# Patient Record
Sex: Male | Born: 1985 | Race: White | Hispanic: No | Marital: Single | Smoking: Former smoker
Health system: Southern US, Community
[De-identification: ages and names within clinical notes are randomized; demographics above are authoritative.]

## PROBLEM LIST (undated history)

## (undated) DIAGNOSIS — F329 Major depressive disorder, single episode, unspecified: Secondary | ICD-10-CM

## (undated) DIAGNOSIS — F32A Depression, unspecified: Secondary | ICD-10-CM

## (undated) DIAGNOSIS — F419 Anxiety disorder, unspecified: Secondary | ICD-10-CM

---

## 2004-03-22 HISTORY — PX: HAND SURGERY: SHX662

## 2015-09-27 ENCOUNTER — Emergency Department (HOSPITAL_COMMUNITY)
Admission: EM | Admit: 2015-09-27 | Discharge: 2015-09-27 | Disposition: A | Discharge status: Home / Self Care | Payer: Worker's Compensation | Attending: Emergency Medicine | Admitting: Emergency Medicine

## 2015-09-27 ENCOUNTER — Encounter (HOSPITAL_COMMUNITY): Payer: Self-pay | Admitting: Emergency Medicine

## 2015-09-27 DIAGNOSIS — Z87891 Personal history of nicotine dependence: Secondary | ICD-10-CM | POA: Diagnosis not present

## 2015-09-27 DIAGNOSIS — F329 Major depressive disorder, single episode, unspecified: Secondary | ICD-10-CM | POA: Diagnosis not present

## 2015-09-27 DIAGNOSIS — Y9389 Activity, other specified: Secondary | ICD-10-CM | POA: Diagnosis not present

## 2015-09-27 DIAGNOSIS — M542 Cervicalgia: Secondary | ICD-10-CM

## 2015-09-27 DIAGNOSIS — S299XXA Unspecified injury of thorax, initial encounter: Secondary | ICD-10-CM | POA: Insufficient documentation

## 2015-09-27 DIAGNOSIS — S199XXA Unspecified injury of neck, initial encounter: Secondary | ICD-10-CM | POA: Diagnosis not present

## 2015-09-27 DIAGNOSIS — Y92410 Unspecified street and highway as the place of occurrence of the external cause: Secondary | ICD-10-CM | POA: Diagnosis not present

## 2015-09-27 DIAGNOSIS — F419 Anxiety disorder, unspecified: Secondary | ICD-10-CM | POA: Diagnosis not present

## 2015-09-27 DIAGNOSIS — Z79899 Other long term (current) drug therapy: Secondary | ICD-10-CM | POA: Diagnosis not present

## 2015-09-27 DIAGNOSIS — Y990 Civilian activity done for income or pay: Secondary | ICD-10-CM | POA: Insufficient documentation

## 2015-09-27 HISTORY — DX: Major depressive disorder, single episode, unspecified: F32.9

## 2015-09-27 HISTORY — DX: Anxiety disorder, unspecified: F41.9

## 2015-09-27 HISTORY — DX: Depression, unspecified: F32.A

## 2015-09-27 MED ORDER — NAPROXEN 250 MG PO TABS: 250.0000 mg | ORAL_TABLET | Freq: Two times a day (BID) | ORAL | Status: DC

## 2015-09-27 MED ORDER — METHOCARBAMOL 500 MG PO TABS: 500.0000 mg | ORAL_TABLET | Freq: Two times a day (BID) | ORAL | Status: DC | PRN
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
## 2018-11-03 MED FILL — HYDROCODON-APAP 5-325: 5-325 | 6 days supply | Qty: 6 | Fill #0

## 2018-11-13 ENCOUNTER — Emergency Department (HOSPITAL_BASED_OUTPATIENT_CLINIC_OR_DEPARTMENT_OTHER)
Admission: EM | Admit: 2018-11-13 | Discharge: 2018-11-13 | Disposition: A | Discharge status: Home / Self Care | Payer: No Typology Code available for payment source | Attending: Emergency Medicine | Admitting: Emergency Medicine

## 2018-11-13 ENCOUNTER — Other Ambulatory Visit: Payer: Self-pay

## 2018-11-13 ENCOUNTER — Encounter (HOSPITAL_BASED_OUTPATIENT_CLINIC_OR_DEPARTMENT_OTHER): Payer: Self-pay | Admitting: Emergency Medicine

## 2018-11-13 ENCOUNTER — Emergency Department (HOSPITAL_BASED_OUTPATIENT_CLINIC_OR_DEPARTMENT_OTHER): Payer: No Typology Code available for payment source

## 2018-11-13 DIAGNOSIS — F17220 Nicotine dependence, chewing tobacco, uncomplicated: Secondary | ICD-10-CM | POA: Diagnosis not present

## 2018-11-13 DIAGNOSIS — S298XXA Other specified injuries of thorax, initial encounter: Secondary | ICD-10-CM

## 2018-11-13 DIAGNOSIS — W11XXXA Fall on and from ladder, initial encounter: Secondary | ICD-10-CM | POA: Insufficient documentation

## 2018-11-13 DIAGNOSIS — Z79899 Other long term (current) drug therapy: Secondary | ICD-10-CM | POA: Diagnosis not present

## 2018-11-13 DIAGNOSIS — S299XXA Unspecified injury of thorax, initial encounter: Secondary | ICD-10-CM | POA: Diagnosis present

## 2018-11-13 DIAGNOSIS — Y9389 Activity, other specified: Secondary | ICD-10-CM | POA: Insufficient documentation

## 2018-11-13 DIAGNOSIS — S20211A Contusion of right front wall of thorax, initial encounter: Secondary | ICD-10-CM

## 2018-11-13 DIAGNOSIS — Y999 Unspecified external cause status: Secondary | ICD-10-CM | POA: Diagnosis not present

## 2018-11-13 DIAGNOSIS — Y929 Unspecified place or not applicable: Secondary | ICD-10-CM | POA: Diagnosis not present

## 2018-11-13 IMAGING — DX DG RIBS W/ CHEST 3+V*R*
5 series · 5 of 5 positions shown · non-contrast
Comparison: None.

CLINICAL DATA: Right posterior rib pain post 8 foot fall.

EXAM:
RIGHT RIBS AND CHEST - 3+ VIEW

[chest pa]
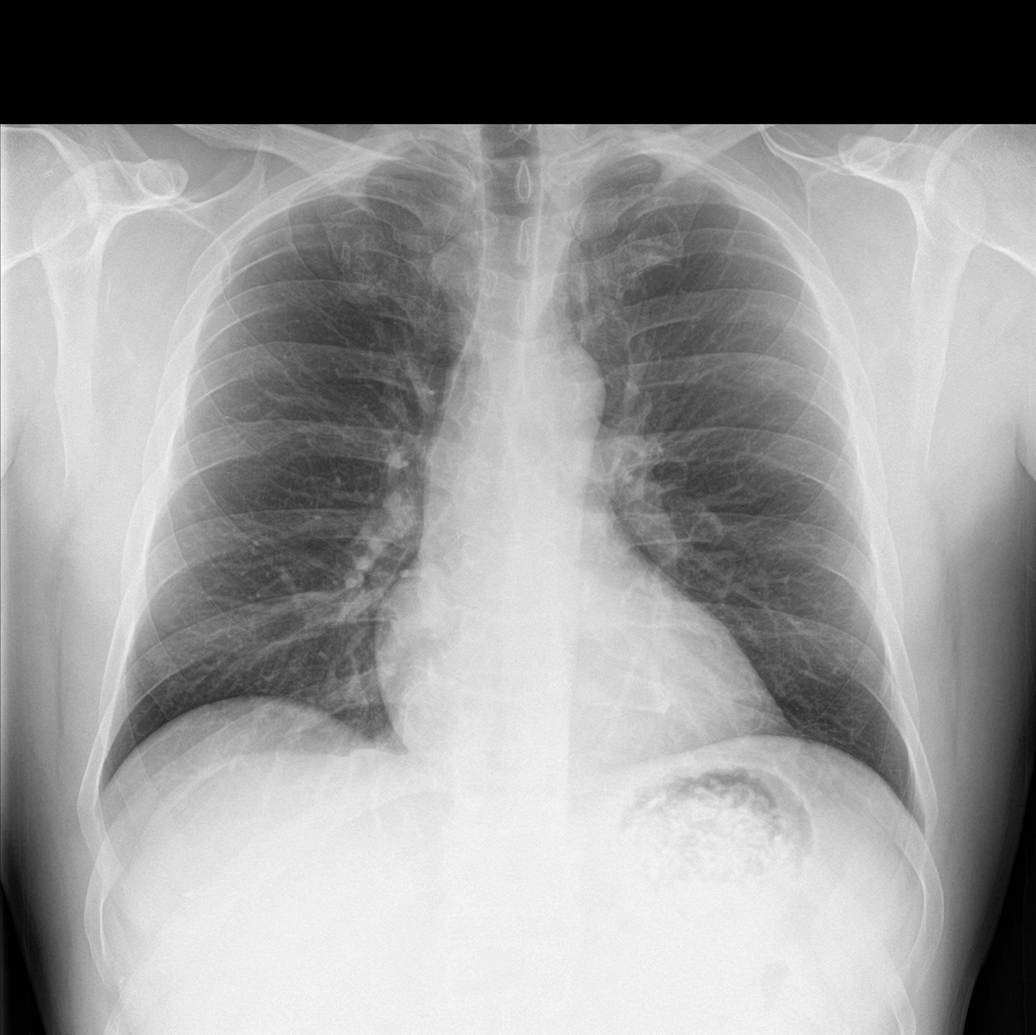

[rib ap (1 of 2)]
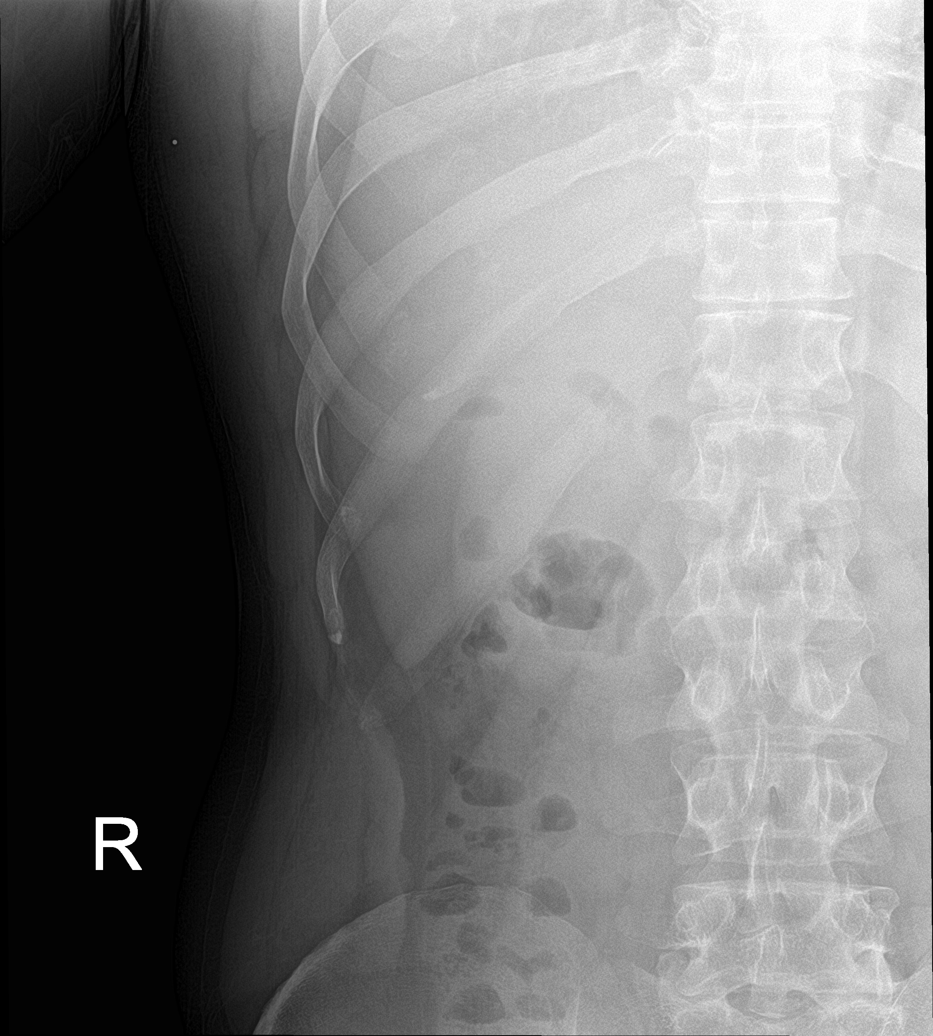

[rib ap (2 of 2)]
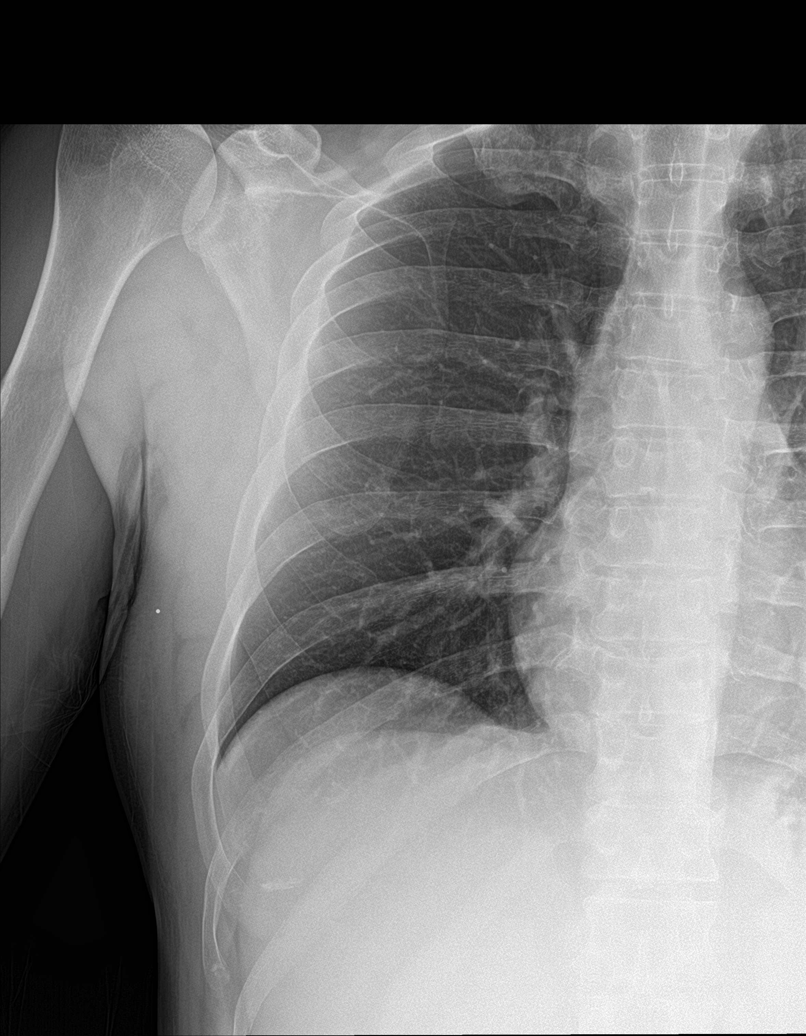

[rib ap obl (1 of 2)]
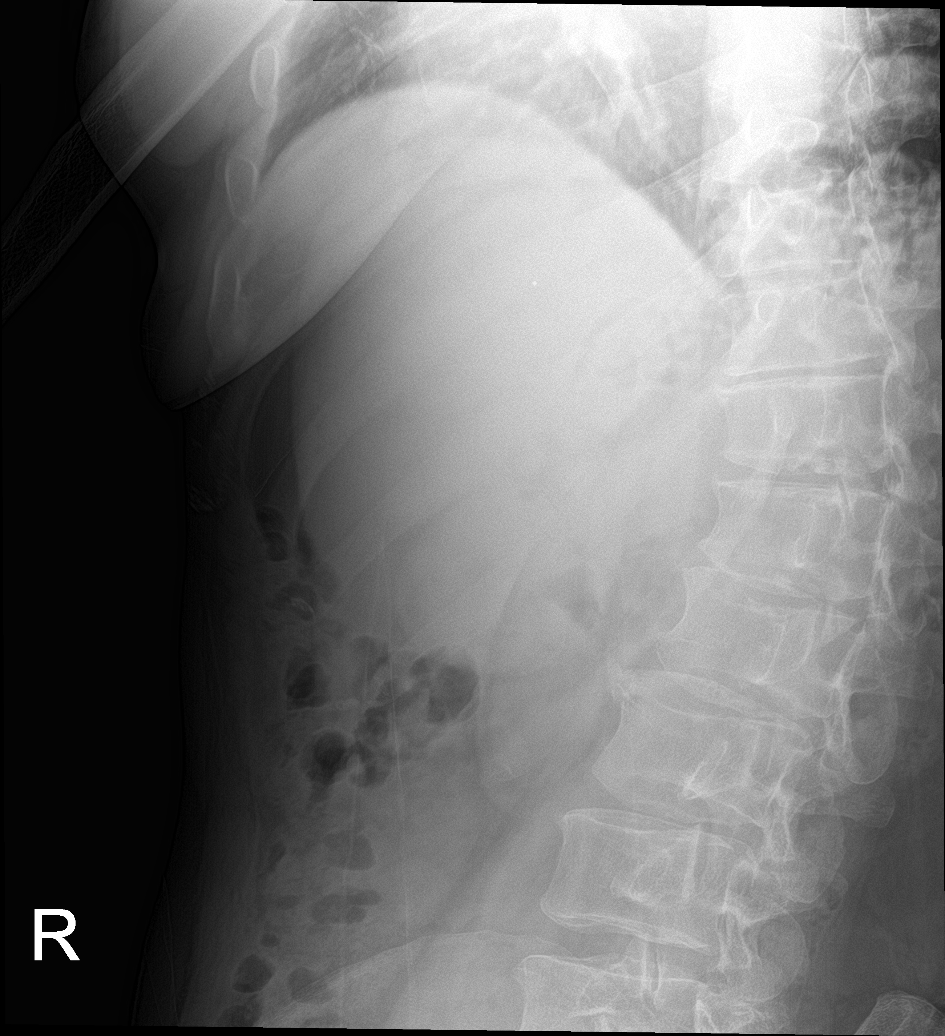

[rib ap obl (2 of 2)]
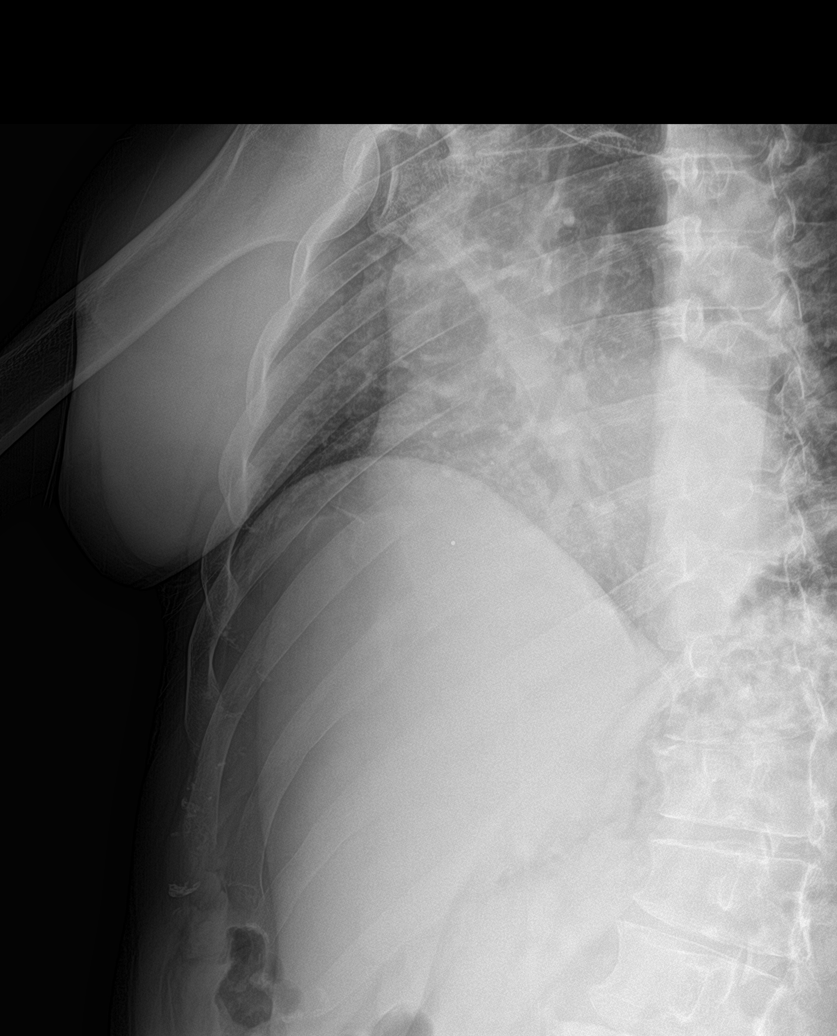

[5 of 5 positions shown; findings below may reference images not displayed]

FINDINGS: No fracture or other bone lesions are seen involving the ribs. There
is no evidence of pneumothorax or pleural effusion. Both lungs are
clear. Heart size and mediastinal contours are within normal limits.
IMPRESSION: Negative.

## 2018-11-13 NOTE — ED Triage Notes (Signed)
Patient states that he fell from a ladder that was 12 ft off the ground - he states that he fell 8 feet. Patient has pain to his right axilla area

## 2018-11-13 NOTE — ED Provider Notes (Signed)
[facility] EMERGENCY DEPARTMENT Provider Note   CSN: [id number] Arrival date & time: 11/13/18  1519    History   Chief Complaint Chief Complaint  Patient presents with  . Fall    HPI Kyle Black is a 33 y.o. male presents for evaluation after mechanical fall that occurred off a ladder at approximate 11:30 AM.  Patient reports that he was standing on a ladder that was placed with 1 and on the ground on 1 and on a stair.  He states that the ladder started to fall causing him to fall.  He reports that he was standing on 8 foot ladder but was apparently 12 feet off the ground.  He reports that he hit the middle rung of the ladder with his right lateral chest wall.  He states he did not hit the floor.  No head injury, LOC.  He states he has not any difficulty breathing.  He reports pain to the right lateral chest wall that extends to the posterior aspect.  He states that this pain is worse when he tries to move his arm up but does not have any pain in his shoulder, humerus.  Patient reports he did not take anything for the pain.  Patient denies any neck pain, back pain, difficulty walking, abdominal pain, nausea/vomiting.  Patient is not on blood thinners.     The history is provided by the patient.    Past Medical History:  Diagnosis Date  . Anxiety and depression     There are no active problems to display for this patient.   Past Surgical History:  Procedure Laterality Date  . HAND SURGERY Right 03/2004   right hand fracture        Home Medications    Prior to Admission medications   Medication Sig Start Date End Date Taking? Authorizing Provider  citalopram (CELEXA) 40 MG tablet Take 40 mg by mouth daily.    [provider]  methocarbamol (ROBAXIN) 500 MG tablet Take 1 tablet (500 mg total) by mouth 2 (two) times daily as needed for muscle spasms. 09/27/15   Everlene Farrier, PA-C  naproxen (NAPROSYN) 250 MG tablet Take 1 tablet (250 mg total) by  mouth 2 (two) times daily with a meal. 09/27/15   Everlene Farrier, PA-C    Family History History reviewed. No pertinent family history.  Social History Social History   Tobacco Use  . Smoking status: Former Smoker    Last attempt to quit: 09/26/2012    Years since quitting: 6.1  . Smokeless tobacco: Current User    Types: Chew  Substance Use Topics  . Alcohol use: Yes    Comment: occassionally  . Drug use: No     Allergies   Patient has no known allergies.   Review of Systems Review of Systems  Eyes: Negative for visual disturbance.  Respiratory: Negative for shortness of breath.   Cardiovascular: Negative for chest pain.  Gastrointestinal: Negative for abdominal pain, nausea and vomiting.  Genitourinary: Negative for dysuria and hematuria.  Musculoskeletal:       Right lateral chest wall pain  Neurological: Negative for weakness and numbness.  All other systems reviewed and are negative.    Physical Exam Updated Vital Signs BP (!) 133/93 (BP Location: Right Arm)   Pulse 66   Temp 98.2 F (36.8 C) (Oral)   Resp 18   Ht 6\' 1"  (1.854 m)   Wt 95.3 kg   SpO2 100%   BMI 27.71 kg/m  Physical Exam Vitals signs and nursing note reviewed.  Constitutional:      Appearance: Normal appearance. He is well-developed.  HENT:     Head: Normocephalic and atraumatic.  Eyes:     General: Lids are normal.     Conjunctiva/sclera: Conjunctivae normal.     Pupils: Pupils are equal, round, and reactive to light.  Neck:     Musculoskeletal: Full passive range of motion without pain.     Comments: Full flexion/extension and lateral movement of neck fully intact. No bony midline tenderness. No deformities or crepitus.  Cardiovascular:     Rate and Rhythm: Normal rate and regular rhythm.     Pulses: Normal pulses.     Heart sounds: Normal heart sounds. No murmur. No friction rub. No gallop.   Pulmonary:     Effort: Pulmonary effort is normal.     Breath sounds: Normal breath  sounds. No decreased air movement.       Comments: Lungs clear to auscultation bilaterally.  Symmetric chest rise.  No wheezing, rales, rhonchi.  No evidence of diminished breath sounds. Chest:     Chest wall: Tenderness present.       Comments: Tenderness palpation noted to the posterior aspect of the right chest wall approximately #5, 6, 7.  There is an overlying abrasion.  No deformity or crepitus noted. Abdominal:     Palpations: Abdomen is soft. Abdomen is not rigid.     Tenderness: There is no abdominal tenderness. There is no guarding.     Comments: Abdomen is soft, non-distended, non-tender. No rigidity, No guarding. No peritoneal signs.  Musculoskeletal: Normal range of motion.     Comments: No tenderness to palpation to bilateral shoulders, clavicles, elbows, and wrists. No deformities or crepitus noted. FROM of BUE without difficulty.  No tenderness to palpation to bilateral knees and ankles. No deformities or crepitus noted. FROM of BLE without any difficulty.   Skin:    General: Skin is warm and dry.     Capillary Refill: Capillary refill takes less than 2 seconds.  Neurological:     Mental Status: He is alert and oriented to person, place, and time.     Comments: Follows commands, Moves all extremities  5/5 strength to BUE and BLE  Sensation intact throughout all major nerve distributions Normal gait  Psychiatric:        Speech: Speech normal.      ED Treatments / Results  Labs (all labs ordered are listed, but only abnormal results are displayed) Labs Reviewed - No data to display  EKG None  Radiology Dg Ribs Unilateral W/chest Right  Result Date: 11/13/2018 CLINICAL DATA:  Right posterior rib pain post 8 foot fall. EXAM: RIGHT RIBS AND CHEST - 3+ VIEW COMPARISON:  None. FINDINGS: No fracture or other bone lesions are seen involving the ribs. There is no evidence of pneumothorax or pleural effusion. Both lungs are clear. Heart size and mediastinal contours are  within normal limits. IMPRESSION: Negative. Electronically Signed   By: Ted Mcalpine M.D.   On: 11/13/2018 16:13    Procedures Procedures (including critical care time)  Medications Ordered in ED Medications - No data to display   Initial Impression / Assessment and Plan / ED Course  I have reviewed the triage vital signs and the nursing notes.  Pertinent labs & imaging results that were available during my care of the patient were reviewed by me and considered in my medical decision making (see chart for details).  33 year old male who presents for evaluation of mechanical fall off a ladder that occurred earlier this afternoon.  He states no head injury, LOC.  He reports that when he fell, he landed on the ladder on his right lateral chest wall.  He states that he has had pain in that area since then.  He reports pain is slightly worse when he moves his arm up.  He states he has not had any difficulty breathing.  He reports initially when he had the incident, he felt some pain with worsening deep inspiration but reports that is improved.  No neck pain, back pain, numbness/weakness.  He is able to ambulate without any difficulty. Patient is afebrile, non-toxic appearing, sitting comfortably on examination table. Vital signs reviewed and stable.  Exam, he is tenderness palpation noted to the right lateral and posterior chest wall with overlying abrasion.  No deformity or crepitus noted.  Lungs clear to auscultation with no evidence of decreased breath sounds.  No neuro deficits noted on exam.  No neck, back pain.  Will plan for chest x-ray for evaluation of possible rib fracture.  No indication for head CT, CT cervical spine. Given reassuring physical exam and per [facility] CT criteria, no imaging is indicated at this time.  Given reassuring exam and NEXUS criteria, no indication for cervical imaging at this time.   X-ray reviewed.  No evidence of acute bony abnormality, rib  fracture.  Lungs inflated all lung fields.  Discussed results with patient.  He is ambulating in the part without any difficulty.  He states he is ready to go home.  Encourage incentive spirometer use. At this time, patient exhibits no emergent life-threatening condition that require further evaluation in ED or admission. Patient had ample opportunity for questions and discussion. All patient's questions were answered with full understanding. Strict return precautions discussed. Patient expresses understanding and agreement to plan.    Portions of this note were generated with Scientist, clinical (histocompatibility and immunogenetics). Dictation errors may occur despite best attempts at proofreading.   Final Clinical Impressions(s) / ED Diagnoses   Final diagnoses:  Contusion of rib on right side, initial encounter    ED Discharge Orders    None       Maxwell Caul, PA-C 11/14/18 2800    Tegeler, Canary Brim, MD 11/14/18 [phone]

## 2018-11-13 NOTE — ED Notes (Signed)
Patient transported to X-ray 

## 2018-11-13 NOTE — ED Notes (Signed)
Patient verbalizes understanding of discharge instructions. Opportunity for questioning and answers were provided. Armband removed by staff, pt discharged from ED.  

## 2018-11-13 NOTE — Discharge Instructions (Signed)
You can take Tylenol or Ibuprofen as directed for pain. You can alternate Tylenol and Ibuprofen every 4 hours. If you take Tylenol at 1pm, then you can take Ibuprofen at 5pm. Then you can take Tylenol again at 9pm.   Use the incentive spirometer to ensure good deep breathing.  Follow-up with your primary care doctor.  Return emergency department for any difficulty breathing, fevers, any worsening or concerning symptoms.

## 2019-04-20 ENCOUNTER — Encounter (HOSPITAL_COMMUNITY): Payer: Self-pay

## 2019-04-20 ENCOUNTER — Ambulatory Visit (HOSPITAL_COMMUNITY)
Admission: EM | Admit: 2019-04-20 | Discharge: 2019-04-20 | Disposition: A | Discharge status: Home / Self Care | Payer: No Typology Code available for payment source | Attending: Emergency Medicine | Admitting: Emergency Medicine

## 2019-04-20 ENCOUNTER — Other Ambulatory Visit: Payer: Self-pay

## 2019-04-20 DIAGNOSIS — R21 Rash and other nonspecific skin eruption: Secondary | ICD-10-CM | POA: Diagnosis not present

## 2019-04-20 DIAGNOSIS — L259 Unspecified contact dermatitis, unspecified cause: Secondary | ICD-10-CM

## 2019-04-20 MED ORDER — PREDNISONE 50 MG PO TABS: 50.0000 mg | ORAL_TABLET | Freq: Every day | ORAL | 0 refills | Status: AC

## 2019-04-20 MED ORDER — TRIAMCINOLONE ACETONIDE 0.1 % EX CREA: 1.0000 "application " | TOPICAL_CREAM | Freq: Two times a day (BID) | CUTANEOUS | 0 refills | Status: DC

## 2019-04-20 MED ORDER — DOXYCYCLINE HYCLATE 100 MG PO CAPS: 100.0000 mg | ORAL_CAPSULE | Freq: Two times a day (BID) | ORAL | 0 refills | Status: AC

## 2019-04-20 MED FILL — TRIAMCINOLONE 0.1% CREAM: 0.1 | 30 days supply | Qty: 30 | Fill #0

## 2019-04-20 MED FILL — DOXYCYCLINE HYC 100 MG CAPS: 100 | 20 days supply | Qty: 20 | Fill #0

## 2019-04-20 MED FILL — predniSONE 50 MG TABS: 50 | 5 days supply | Qty: 5 | Fill #0

## 2019-04-20 NOTE — ED Provider Notes (Signed)
[facility]    CSN: [id number] Arrival date & time: 04/20/19  0911     History   Chief Complaint Chief Complaint  Patient presents with  . Rash    HPI Kyle Black is a 33 y.o. male no significant PMH, presenting today for evaluation of a rash.  Patient was in [place] approximately 1 week ago was fishing in the water.  He states that he walked through a bunch of coral.  He had a rash initially and some burning sensation.  Symptoms have worsened over the past 48 hours and has developed increased rash and itching.  He states that this is related to a "fire coral".  He denies significant pain.  Denies difficulty moving knees or ankles.  Denies rash elsewhere then his legs and area that was in the water.  He has applied some peroxide.  Denies fevers. .      Past Medical History:  Diagnosis Date  . Anxiety and depression     There are no active problems to display for this patient.   Past Surgical History:  Procedure Laterality Date  . HAND SURGERY Right 03/2004   right hand fracture       Home Medications    Prior to Admission medications   Medication Sig Start Date End Date Taking? Authorizing Provider  citalopram (CELEXA) 40 MG tablet Take 40 mg by mouth daily.    [provider]  doxycycline (VIBRAMYCIN) 100 MG capsule Take 1 capsule (100 mg total) by mouth 2 (two) times daily for 10 days. 04/20/19 04/30/19  ,  C, PA-C  methocarbamol (ROBAXIN) 500 MG tablet Take 1 tablet (500 mg total) by mouth 2 (two) times daily as needed for muscle spasms. 09/27/15   Everlene Farrieransie, William, PA-C  naproxen (NAPROSYN) 250 MG tablet Take 1 tablet (250 mg total) by mouth 2 (two) times daily with a meal. 09/27/15   Everlene Farrieransie, William, PA-C  predniSONE (DELTASONE) 50 MG tablet Take 1 tablet (50 mg total) by mouth daily for 5 days. 04/20/19 04/25/19  ,  C, PA-C  triamcinolone cream (KENALOG) 0.1 % Apply 1 application topically 2 (two) times daily. 04/20/19    , Junius Creamer C, PA-C    Family History History reviewed. No pertinent family history.  Social History Social History   Tobacco Use  . Smoking status: Former Smoker    Quit date: 09/26/2012    Years since quitting: 6.5  . Smokeless tobacco: Current User    Types: Chew  Substance Use Topics  . Alcohol use: Yes    Comment: occassionally  . Drug use: No     Allergies   Patient has no known allergies.   Review of Systems Review of Systems  Constitutional: Negative for fatigue and fever.  HENT: Negative for mouth sores.   Eyes: Negative for redness, itching and visual disturbance.  Respiratory: Negative for shortness of breath.   Cardiovascular: Negative for chest pain and leg swelling.  Gastrointestinal: Negative for nausea and vomiting.  Genitourinary: Negative for genital sores.  Musculoskeletal: Negative for arthralgias and myalgias.  Skin: Positive for color change and rash. Negative for wound.  Neurological: Negative for dizziness, syncope, weakness, light-headedness and headaches.     Physical Exam Triage Vital Signs ED Triage Vitals  Enc Vitals Group     BP 04/20/19 0939 (!) 150/85     Pulse Rate 04/20/19 0939 68     Resp 04/20/19 0939 16     Temp 04/20/19 0939 98.1 F (36.7  C)     Temp Source 04/20/19 0939 Temporal     SpO2 04/20/19 0939 100 %     Weight 04/20/19 1001 212 lb (96.2 kg)     Height --      Head Circumference --      Peak Flow --      Pain Score 04/20/19 1001 0     Pain Loc --      Pain Edu? --      Excl. in [place]? --    No data found.  Updated Vital Signs BP (!) 150/85 (BP Location: Left Arm)   Pulse 68   Temp 98.1 F (36.7 C) (Temporal)   Resp 16   Wt 212 lb (96.2 kg)   SpO2 100%   BMI 27.97 kg/m   Visual Acuity Right Eye Distance:   Left Eye Distance:   Bilateral Distance:    Right Eye Near:   Left Eye Near:    Bilateral Near:     Physical Exam Vitals signs and nursing note reviewed.  Constitutional:       Appearance: He is well-developed.     Comments: No acute distress  HENT:     Head: Normocephalic and atraumatic.     Nose: Nose normal.  Eyes:     Conjunctiva/sclera: Conjunctivae normal.  Neck:     Musculoskeletal: Neck supple.  Cardiovascular:     Rate and Rhythm: Normal rate.  Pulmonary:     Effort: Pulmonary effort is normal. No respiratory distress.  Abdominal:     General: There is no distension.  Musculoskeletal: Normal range of motion.     Comments: Full active range of motion of bilateral knees and ankles, gait normal  Skin:    General: Skin is warm and dry.     Comments: Bilateral lower legs with macular papular erythematous rash, few areas with skin peeling.  No significant increase in warmth, erythema extending beyond lesions or drainage noted  Neurological:     Mental Status: He is alert and oriented to person, place, and time.      UC Treatments / Results  Labs (all labs ordered are listed, but only abnormal results are displayed) Labs Reviewed - No data to display  EKG   Radiology No results found.  Procedures Procedures (including critical care time)  Medications Ordered in UC Medications - No data to display  Initial Impression / Assessment and Plan / UC Course  I have reviewed the triage vital signs and the nursing notes.  Pertinent labs & imaging results that were available during my care of the patient were reviewed by me and considered in my medical decision making (see chart for details).     Patient does appear to have form of contact dermatitis likely secondary to coral.  Does not appear infectious at this time, will provide triamcinolone to use topically as lesions limited to legs.  Provided oral prednisone if topical steroids not relieving itch.  Doxycycline if developing signs of infection.  Monitor for gradual resolution.Discussed strict return precautions. Patient verbalized understanding and is agreeable with plan.  Final Clinical  Impressions(s) / UC Diagnoses   Final diagnoses:  Rash and nonspecific skin eruption  Contact dermatitis, unspecified contact dermatitis type, unspecified trigger     Discharge Instructions     Triamcinolone cream twice daily Prednisone daily for 5 days with topical steroid cream not fully resolving Doxycycline twice daily if developing signs of infection- increased redness, swelling, drainage, pain  ED Prescriptions    Medication Sig Dispense Auth. Provider   doxycycline (VIBRAMYCIN) 100 MG capsule Take 1 capsule (100 mg total) by mouth 2 (two) times daily for 10 days. 20 capsule ,  C, PA-C   triamcinolone cream (KENALOG) 0.1 % Apply 1 application topically 2 (two) times daily. 30 g ,  C, PA-C   predniSONE (DELTASONE) 50 MG tablet Take 1 tablet (50 mg total) by mouth daily for 5 days. 5 tablet ,  C, PA-C     Controlled Substance Prescriptions [place] Controlled Substance Registry consulted? Not Applicable   Lew Dawes,  C, [place]PA-C 04/20/19 1023

## 2019-04-20 NOTE — Discharge Instructions (Signed)
Triamcinolone cream twice daily Prednisone daily for 5 days with topical steroid cream not fully resolving Doxycycline twice daily if developing signs of infection- increased redness, swelling, drainage, pain

## 2019-04-20 NOTE — ED Triage Notes (Signed)
Pt states he has rash he was in some coral.. pt states itches.

## 2019-06-29 ENCOUNTER — Encounter (HOSPITAL_COMMUNITY): Payer: Self-pay

## 2019-06-29 ENCOUNTER — Other Ambulatory Visit: Payer: Self-pay

## 2019-06-29 ENCOUNTER — Ambulatory Visit (HOSPITAL_COMMUNITY)
Admission: EM | Admit: 2019-06-29 | Discharge: 2019-06-29 | Disposition: A | Discharge status: Home / Self Care | Payer: Non-veteran care | Attending: Emergency Medicine | Admitting: Emergency Medicine

## 2019-06-29 ENCOUNTER — Telehealth (HOSPITAL_COMMUNITY): Payer: Self-pay | Admitting: Emergency Medicine

## 2019-06-29 ENCOUNTER — Ambulatory Visit (INDEPENDENT_AMBULATORY_CARE_PROVIDER_SITE_OTHER): Payer: Non-veteran care

## 2019-06-29 DIAGNOSIS — W208XXA Other cause of strike by thrown, projected or falling object, initial encounter: Secondary | ICD-10-CM | POA: Diagnosis not present

## 2019-06-29 DIAGNOSIS — S9031XA Contusion of right foot, initial encounter: Secondary | ICD-10-CM

## 2019-06-29 IMAGING — DX DG FOOT COMPLETE 3+V*R*
2 series · 2 of 2 positions shown · non-contrast
Comparison: None.

CLINICAL DATA: Right foot pain and swelling after blunt trauma
today. The patient dropped a 45 pound weight on his foot.

EXAM:
RIGHT FOOT COMPLETE - 3+ VIEW

[foot ap]
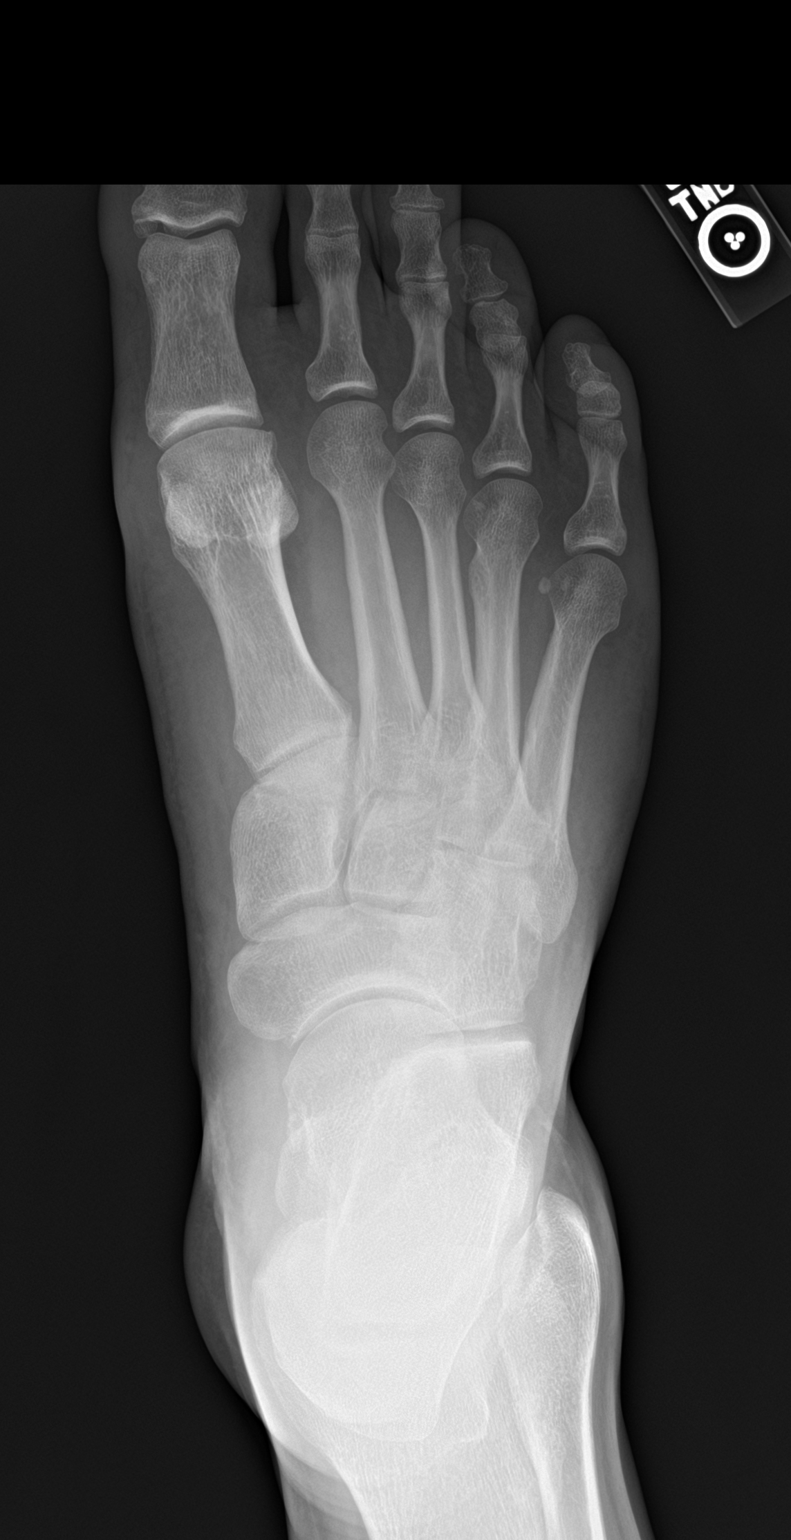

[foot obl]
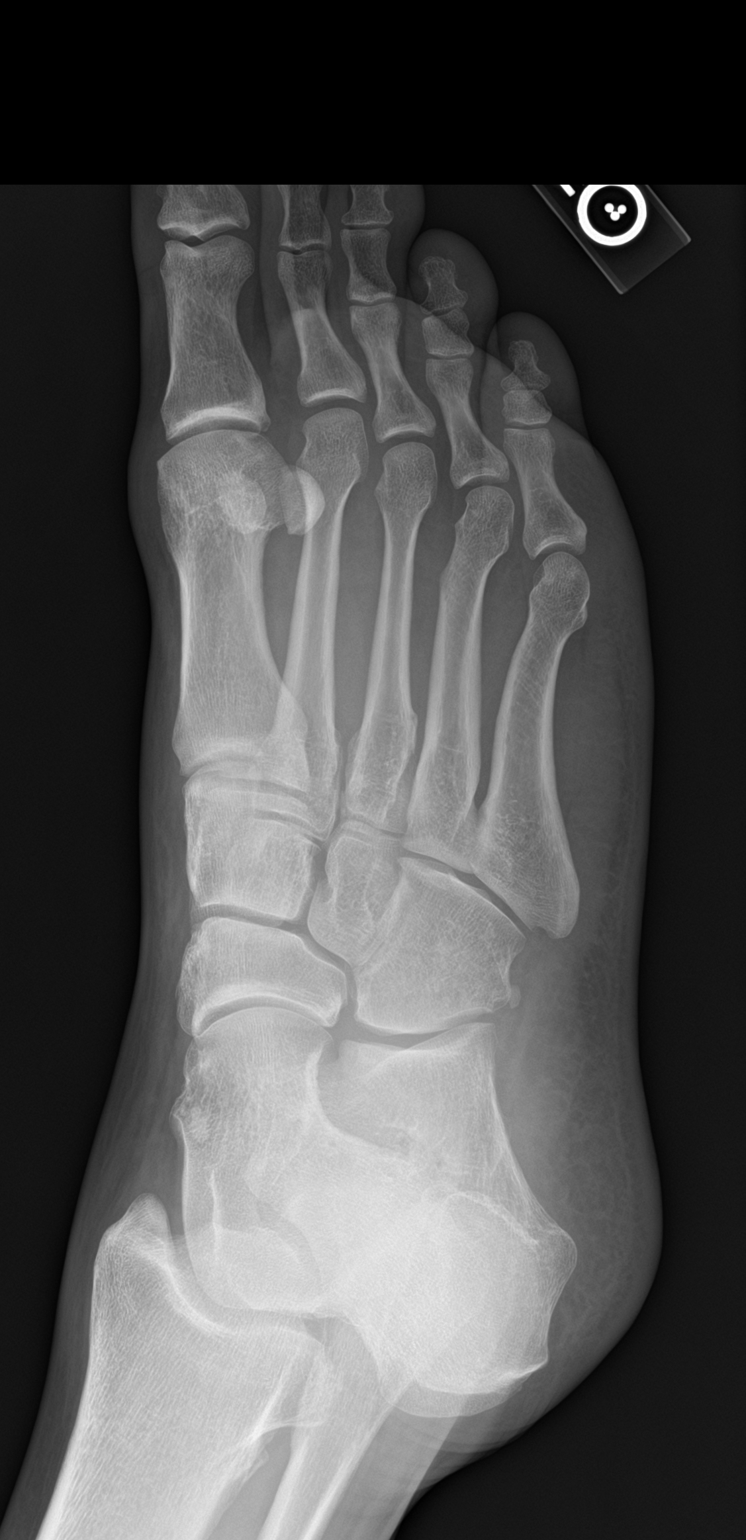

[2 of 2 positions shown; findings below may reference images not displayed]

FINDINGS: There is no fracture or dislocation. There is dorsal soft tissue
swelling at the tarsal metatarsal level. No arthritic changes.
IMPRESSION: No osseous abnormality. Soft tissue swelling at the tarsometatarsal
level.

## 2019-06-29 MED ORDER — IBUPROFEN 800 MG PO TABS: 800.0000 mg | ORAL_TABLET | Freq: Three times a day (TID) | ORAL | 0 refills | Status: DC | PRN

## 2019-06-29 MED FILL — IBUPROFEN 800 MG TAB: 800 | 7 days supply | Qty: 21 | Fill #0

## 2019-06-29 NOTE — ED Provider Notes (Signed)
[place]    CSN: [id number] Arrival date & time: 06/29/19  1219      History   Chief Complaint Chief Complaint  Patient presents with  . Foot Injury    At 11:30am today pt states he dropped a weight plate on right foot    HPI Kyle Black is a 33 y.o. male.   Patient presents with right foot pain and swelling.  States he accidentally dropped a weight on his right foot approximately 2 hours PTA.  He denies numbness or weakness in his toes or foot.  He denies other injury.  No pertinent medical history.  The history is provided by the patient.    Past Medical History:  Diagnosis Date  . Anxiety and depression     There are no active problems to display for this patient.   Past Surgical History:  Procedure Laterality Date  . HAND SURGERY Right 03/2004   right hand fracture       Home Medications    Prior to Admission medications   Medication Sig Start Date End Date Taking? Authorizing Provider  citalopram (CELEXA) 40 MG tablet Take 40 mg by mouth daily.    [provider]  ibuprofen (ADVIL) 800 MG tablet Take 1 tablet (800 mg total) by mouth every 8 (eight) hours as needed. 06/29/19   Sharion Balloon, NP    Family History Family History  Problem Relation Age of Onset  . Healthy Mother   . Healthy Father     Social History Social History   Tobacco Use  . Smoking status: Former Smoker    Quit date: 09/26/2012    Years since quitting: 6.7  . Smokeless tobacco: Current User    Types: Chew  Substance Use Topics  . Alcohol use: Yes    Comment: occassionally  . Drug use: No     Allergies   Patient has no known allergies.   Review of Systems Review of Systems  Constitutional: Negative for chills and fever.  HENT: Negative for ear pain and sore throat.   Eyes: Negative for pain and visual disturbance.  Respiratory: Negative for cough and shortness of breath.   Cardiovascular: Negative for chest pain and palpitations.   Gastrointestinal: Negative for abdominal pain and vomiting.  Genitourinary: Negative for dysuria and hematuria.  Musculoskeletal: Positive for arthralgias. Negative for back pain.  Skin: Negative for color change and rash.  Neurological: Negative for seizures and syncope.  All other systems reviewed and are negative.    Physical Exam Triage Vital Signs ED Triage Vitals [06/29/19 1250]  Enc Vitals Group     BP (!) 149/91     Pulse Rate 78     Resp      Temp      Temp Source Oral     SpO2 98 %     Weight      Height      Head Circumference      Peak Flow      Pain Score      Pain Loc      Pain Edu?      Excl. in [place]?    No data found.  Updated Vital Signs BP (!) 149/91 (BP Location: Right Arm)   Pulse 78   SpO2 98%   Visual Acuity Right Eye Distance:   Left Eye Distance:   Bilateral Distance:    Right Eye Near:   Left Eye Near:    Bilateral Near:  Physical Exam Vitals signs and nursing note reviewed.  Constitutional:      Appearance: He is well-developed.  HENT:     Head: Normocephalic and atraumatic.  Eyes:     Conjunctiva/sclera: Conjunctivae normal.  Neck:     Musculoskeletal: Neck supple.  Cardiovascular:     Rate and Rhythm: Normal rate and regular rhythm.     Heart sounds: No murmur.  Pulmonary:     Effort: Pulmonary effort is normal. No respiratory distress.     Breath sounds: Normal breath sounds.  Abdominal:     Palpations: Abdomen is soft.     Tenderness: There is no abdominal tenderness.  Musculoskeletal: Normal range of motion.        General: Swelling and tenderness present.       Feet:  Feet:     Comments: Tender to palpation, edematous.  Skin:    General: Skin is warm and dry.     Capillary Refill: Capillary refill takes less than 2 seconds.     Findings: No bruising, erythema, lesion or rash.  Neurological:     General: No focal deficit present.     Mental Status: He is alert and oriented to person, place, and time.      Sensory: No sensory deficit.     Motor: No weakness.      UC Treatments / Results  Labs (all labs ordered are listed, but only abnormal results are displayed) Labs Reviewed - No data to display  EKG   Radiology Dg Foot Complete Right  Result Date: 06/29/2019 CLINICAL DATA:  Right foot pain and swelling after blunt trauma today. The patient dropped a 45 pound weight on his foot. EXAM: RIGHT FOOT COMPLETE - 3+ VIEW COMPARISON:  None. FINDINGS: There is no fracture or dislocation. There is dorsal soft tissue swelling at the tarsal metatarsal level. No arthritic changes. IMPRESSION: No osseous abnormality. Soft tissue swelling at the tarsometatarsal level. Electronically Signed   By: Francene Boyers M.D.   On: 06/29/2019 14:18    Procedures Procedures (including critical care time)  Medications Ordered in UC Medications - No data to display  Initial Impression / Assessment and Plan / UC Course  I have reviewed the triage vital signs and the nursing notes.  Pertinent labs & imaging results that were available during my care of the patient were reviewed by me and considered in my medical decision making (see chart for details).     Right foot contusion.  Treating with ibuprofen, RICE, and ace wrap.  Instructed patient to follow-up with his PCP or an orthopedist if his pain persists or worsens or if he develops new symptoms such as numbness, paresthesias, weakness.  Patient agrees to plan of care.     Final Clinical Impressions(s) / UC Diagnoses   Final diagnoses:  Contusion of right foot, initial encounter     Discharge Instructions     Take the ibuprofen as prescribed.  Rest and elevate your foot.  Apply ice packs 2-3 times a day for up to 20 minutes each.  Wear the Ace wrap as needed for comfort.    Follow up with your primary care provider or an orthopedist if you symptoms continue or worsen;  Or if you develop new symptoms, such as numbness, tingling, or weakness.         ED Prescriptions    Medication Sig Dispense Auth. Provider   ibuprofen (ADVIL) 800 MG tablet Take 1 tablet (800 mg total) by mouth every 8 (  eight) hours as needed. 21 tablet Mickie Bailate, Carson Bogden H, NP     PDMP not reviewed this encounter.   Mickie Bailate, Tyquon Near H, NP 06/29/19 1429

## 2019-06-29 NOTE — ED Triage Notes (Addendum)
Pt dropped weight plate on right foot at gym, he states he thinks its broken.

## 2019-06-29 NOTE — Discharge Instructions (Addendum)
Take the ibuprofen as prescribed.  Rest and elevate your foot.  Apply ice packs 2-3 times a day for up to 20 minutes each.  Wear the Ace wrap as needed for comfort.   ° °Follow up with your primary care provider or an orthopedist if you symptoms continue or worsen;  Or if you develop new symptoms, such as numbness, tingling, or weakness.   ° ° ° °

## 2019-09-17 ENCOUNTER — Emergency Department (HOSPITAL_COMMUNITY)
Admission: EM | Admit: 2019-09-17 | Discharge: 2019-09-17 | Disposition: A | Discharge status: Home / Self Care | Payer: No Typology Code available for payment source | Attending: Emergency Medicine | Admitting: Emergency Medicine

## 2019-09-17 ENCOUNTER — Emergency Department (HOSPITAL_COMMUNITY): Payer: No Typology Code available for payment source

## 2019-09-17 ENCOUNTER — Other Ambulatory Visit: Payer: Self-pay

## 2019-09-17 DIAGNOSIS — M71162 Other infective bursitis, left knee: Secondary | ICD-10-CM | POA: Diagnosis not present

## 2019-09-17 DIAGNOSIS — M7110 Other infective bursitis, unspecified site: Secondary | ICD-10-CM

## 2019-09-17 DIAGNOSIS — Z5320 Procedure and treatment not carried out because of patient's decision for unspecified reasons: Secondary | ICD-10-CM | POA: Diagnosis not present

## 2019-09-17 DIAGNOSIS — Z79899 Other long term (current) drug therapy: Secondary | ICD-10-CM | POA: Insufficient documentation

## 2019-09-17 DIAGNOSIS — M25562 Pain in left knee: Secondary | ICD-10-CM | POA: Diagnosis present

## 2019-09-17 DIAGNOSIS — F17220 Nicotine dependence, chewing tobacco, uncomplicated: Secondary | ICD-10-CM | POA: Diagnosis not present

## 2019-09-17 DIAGNOSIS — R52 Pain, unspecified: Secondary | ICD-10-CM

## 2019-09-17 LAB — BASIC METABOLIC PANEL
Anion gap: 12 (ref 5–15)
BUN: 15 mg/dL (ref 6–20)
CO2: 25 mmol/L (ref 22–32)
Calcium: 8.7 mg/dL — ABNORMAL LOW (ref 8.9–10.3)
Chloride: 99 mmol/L (ref 98–111)
Creatinine, Ser: 1.18 mg/dL (ref 0.61–1.24)
GFR calc Af Amer: >60 mL/min (ref >60)
GFR calc non Af Amer: >60 mL/min (ref >60)
Glucose, Bld: 120 mg/dL — ABNORMAL HIGH (ref 70–99)
Potassium: 4.1 mmol/L (ref 3.5–5.1)
Sodium: 136 mmol/L (ref 135–145)

## 2019-09-17 LAB — CBC WITH DIFFERENTIAL/PLATELET
Abs Immature Granulocytes: 0.08 10*3/uL — ABNORMAL HIGH (ref 0.00–0.07)
Basophils Absolute: 0.1 10*3/uL (ref 0.0–0.1)
Basophils Relative: 0 %
Eosinophils Absolute: 0 10*3/uL (ref 0.0–0.5)
Eosinophils Relative: 0 %
HCT: 43.1 % (ref 39.0–52.0)
Hemoglobin: 14.9 g/dL (ref 13.0–17.0)
Immature Granulocytes: 1 %
Lymphocytes Relative: 5 %
Lymphs Abs: 0.9 10*3/uL (ref 0.7–4.0)
MCH: 31.2 pg (ref 26.0–34.0)
MCHC: 34.6 g/dL (ref 30.0–36.0)
MCV: 90.2 fL (ref 80.0–100.0)
Monocytes Absolute: 1.6 10*3/uL — ABNORMAL HIGH (ref 0.1–1.0)
Monocytes Relative: 9 %
Neutro Abs: 14.5 10*3/uL — ABNORMAL HIGH (ref 1.7–7.7)
Neutrophils Relative %: 85 %
Platelets: 213 10*3/uL (ref 150–400)
RBC: 4.78 MIL/uL (ref 4.22–5.81)
RDW: 11.8 % (ref 11.5–15.5)
WBC: 17.1 10*3/uL — ABNORMAL HIGH (ref 4.0–10.5)
nRBC: 0 % (ref 0.0–0.2)

## 2019-09-17 IMAGING — CR DG KNEE COMPLETE 4+V*L*
4 series · 4 of 4 positions shown · non-contrast
Comparison: None.

CLINICAL DATA: Pain and swelling with redness

EXAM:
LEFT KNEE - COMPLETE 4+ VIEW

[knee ap]
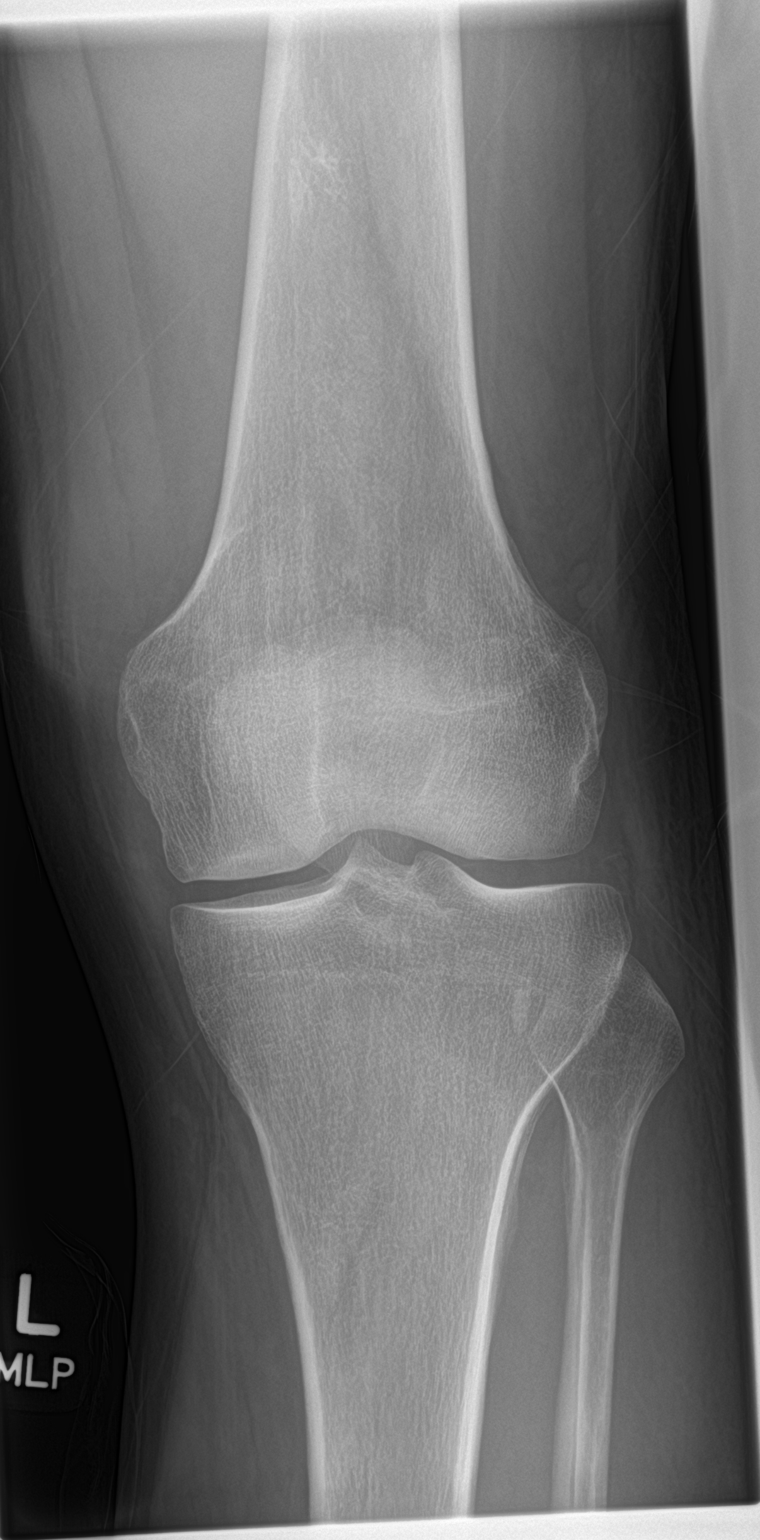

[knee lat]
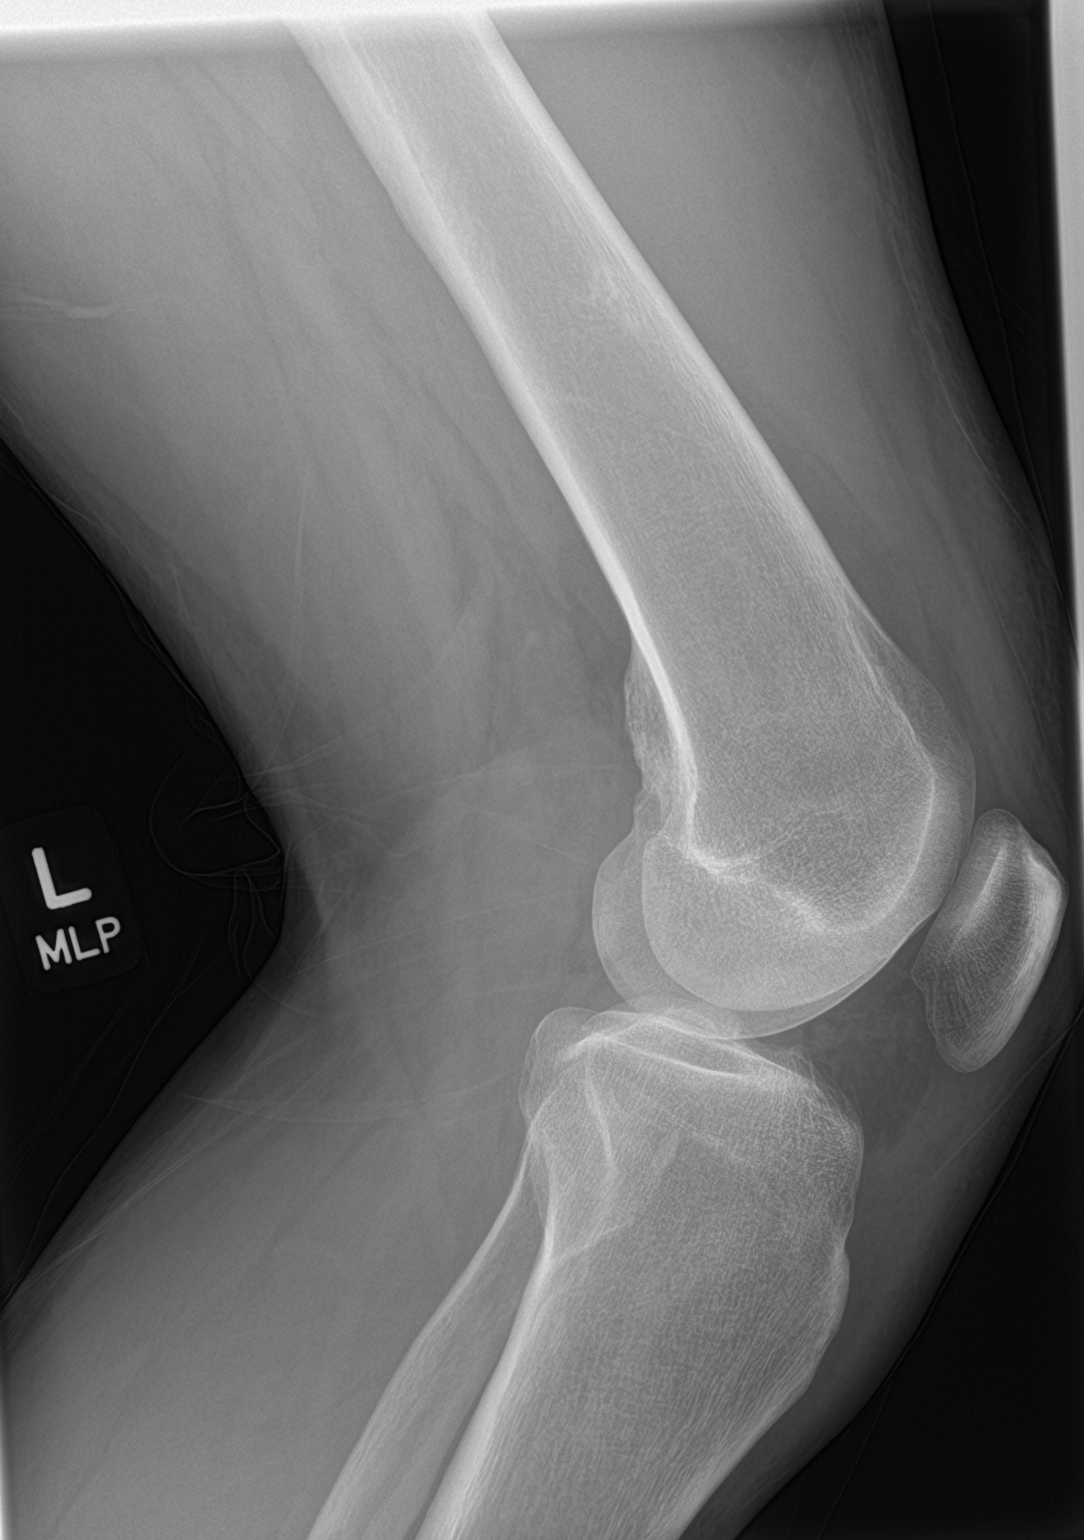

[knee obl (1 of 2)]
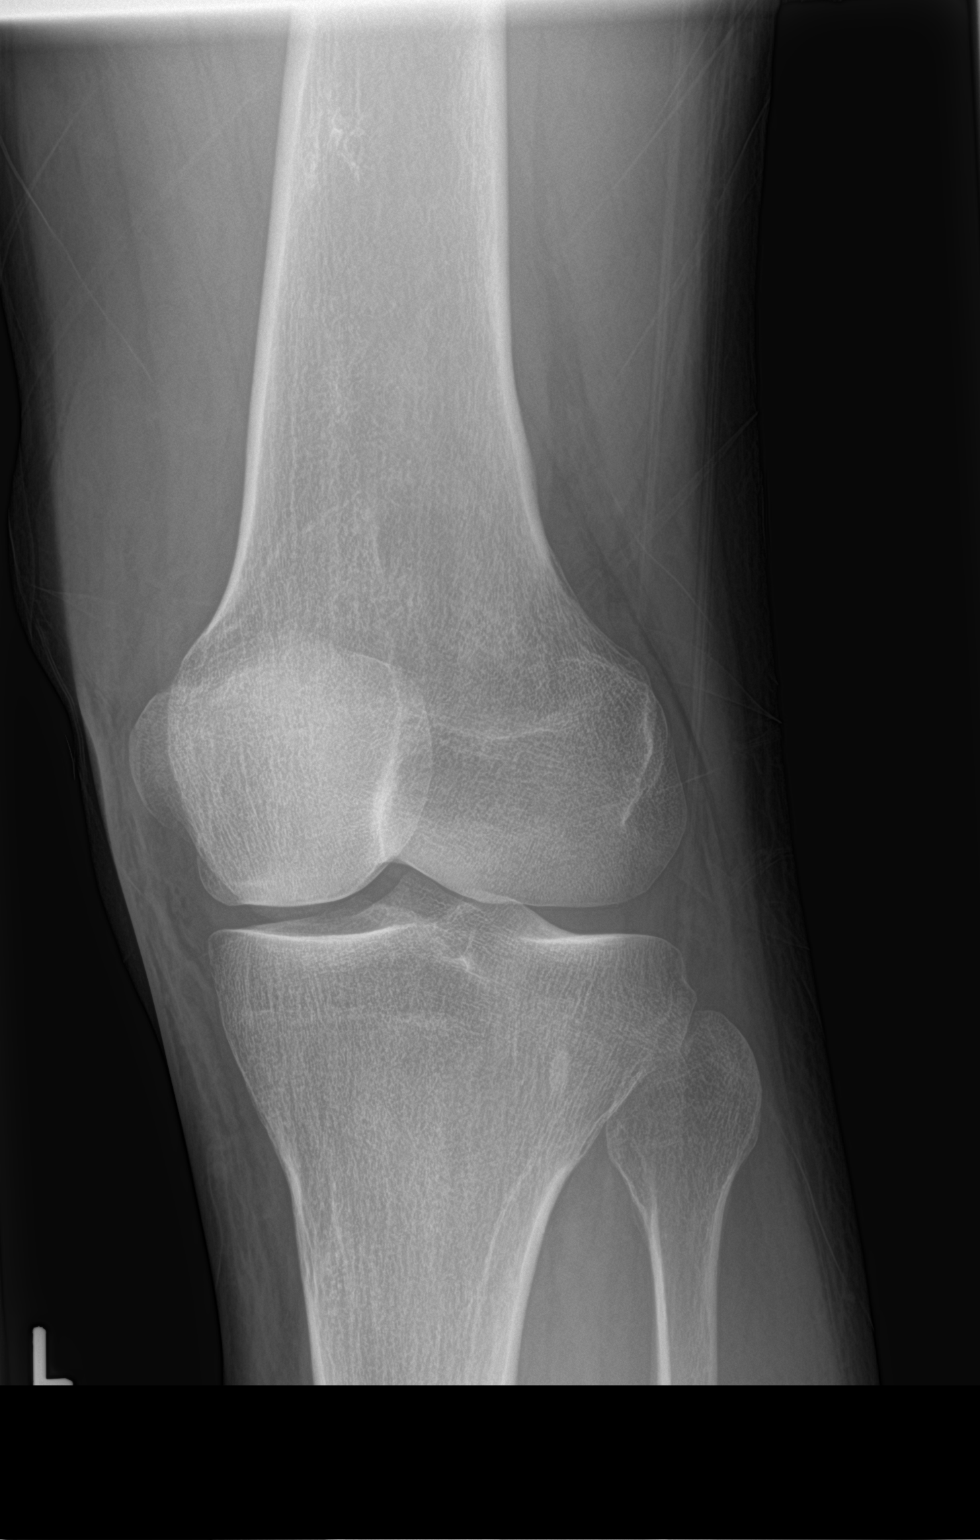

[knee obl (2 of 2)]
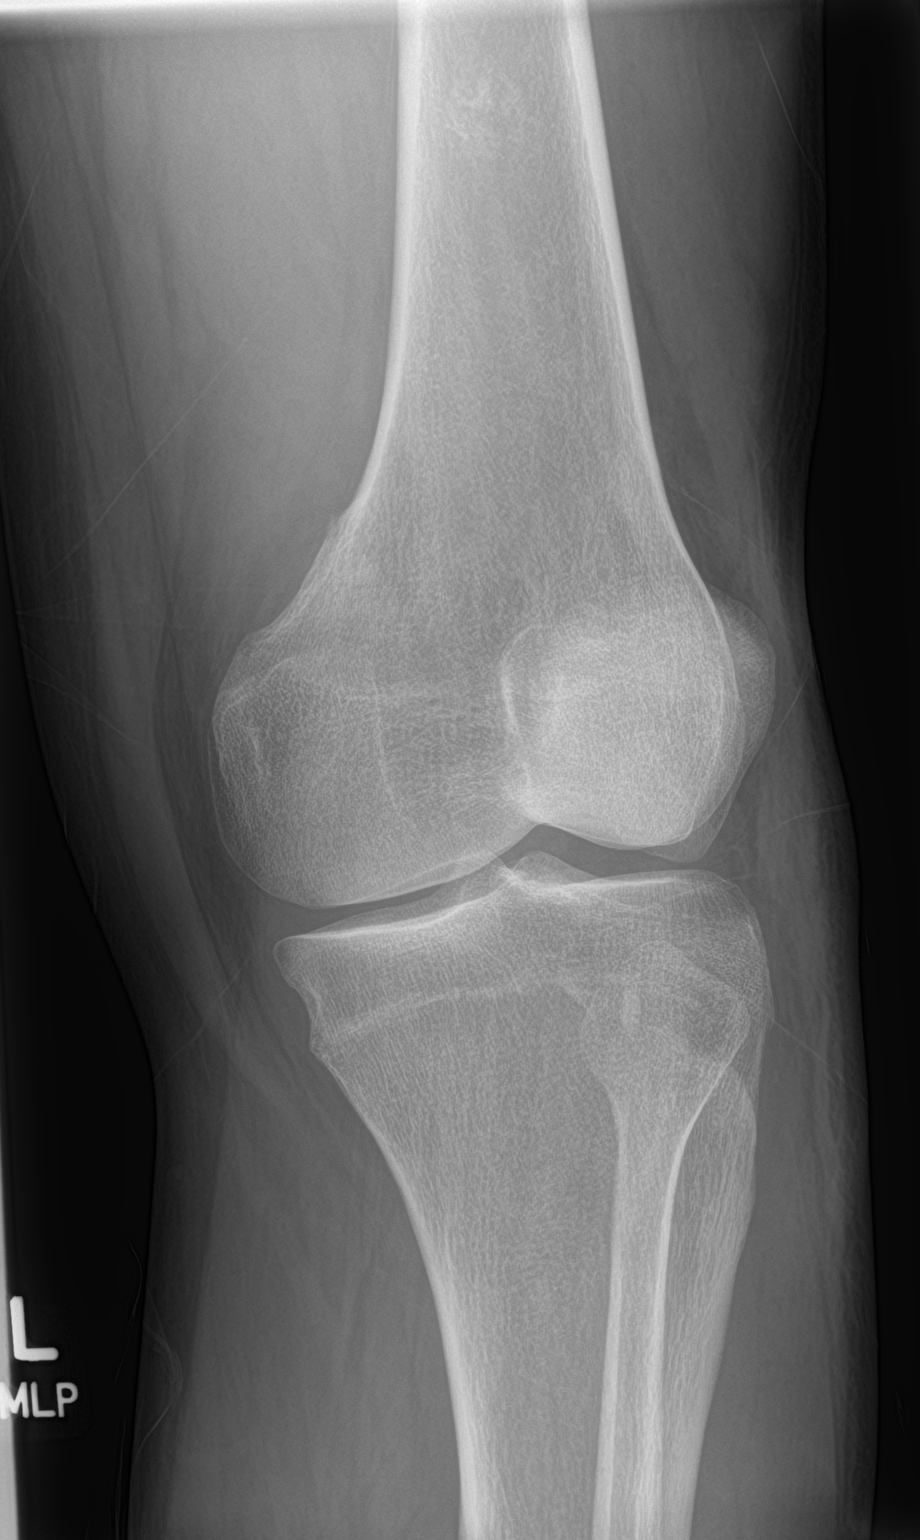

[4 of 4 positions shown; findings below may reference images not displayed]

FINDINGS: No evidence of fracture, dislocation, or joint effusion. There
appears to be slight cortical thickening of the posterior distal
femoral metadiaphysis which could be from prior cortical desmoid or
stress reaction. No evidence of arthropathy or other focal bone
abnormality. Mild prepatellar subcutaneous edema is noted.
IMPRESSION: No acute osseous abnormality.

## 2019-09-17 MED ORDER — CEPHALEXIN 500 MG PO CAPS: 500.0000 mg | ORAL_CAPSULE | Freq: Four times a day (QID) | ORAL | 0 refills | Status: DC

## 2019-09-17 MED ORDER — ACETAMINOPHEN ER 650 MG PO TBCR: 650.0000 mg | EXTENDED_RELEASE_TABLET | Freq: Three times a day (TID) | ORAL | 0 refills | Status: DC | PRN

## 2019-09-17 MED ORDER — CEFTRIAXONE SODIUM 1 G IJ SOLR: 500.0000 mg | Freq: Once | INTRAMUSCULAR | Status: DC

## 2019-09-17 MED ORDER — NAPROXEN 375 MG PO TABS: 375.0000 mg | ORAL_TABLET | Freq: Two times a day (BID) | ORAL | 0 refills | Status: DC

## 2019-09-17 MED ORDER — DOXYCYCLINE HYCLATE 100 MG PO TABS
100.0000 mg | ORAL_TABLET | Freq: Once | ORAL | Status: AC
Start: 2019-09-17 — End: 2019-09-17
  Administered 2019-09-17: 08:00:00 100 mg via ORAL
  Filled 2019-09-17: qty 1

## 2019-09-17 MED ORDER — ACETAMINOPHEN 325 MG PO TABS
650.0000 mg | ORAL_TABLET | Freq: Once | ORAL | Status: AC
Start: 2019-09-17 — End: 2019-09-17
  Administered 2019-09-17: 650 mg via ORAL
  Filled 2019-09-17: qty 2

## 2019-09-17 MED ORDER — CEPHALEXIN 250 MG PO CAPS
500.0000 mg | ORAL_CAPSULE | Freq: Once | ORAL | Status: AC
  Administered 2019-09-17: 500 mg via ORAL
  Filled 2019-09-17: qty 2

## 2019-09-17 MED ORDER — CEFTRIAXONE SODIUM 1 G IJ SOLR
1.0000 g | Freq: Once | INTRAMUSCULAR | Status: AC
  Administered 2019-09-17: 1 g via INTRAVENOUS
  Filled 2019-09-17: qty 10

## 2019-09-17 MED ORDER — VANCOMYCIN HCL 2000 MG/400ML IV SOLN
2000.0000 mg | Freq: Once | INTRAVENOUS | Status: AC
  Administered 2019-09-17: 05:00:00 2000 mg via INTRAVENOUS
  Filled 2019-09-17: qty 400

## 2019-09-17 MED ORDER — DOXYCYCLINE HYCLATE 100 MG PO CAPS: 100.0000 mg | ORAL_CAPSULE | Freq: Two times a day (BID) | ORAL | 0 refills | Status: DC

## 2019-09-17 MED ORDER — NAPROXEN 250 MG PO TABS
500.0000 mg | ORAL_TABLET | Freq: Once | ORAL | Status: AC
  Administered 2019-09-17: 500 mg via ORAL
  Filled 2019-09-17: qty 2

## 2019-09-17 NOTE — ED Notes (Signed)
RN marked diameter of the redden area on the Left Knee with appropriate marker.

## 2019-09-17 NOTE — ED Triage Notes (Signed)
Per pt today about 3 pm he felt something pop in his left knee. Pt said the pain runs down into the front of his leg into his shin. Very painful hard top bare weight. No injury

## 2019-09-17 NOTE — ED Provider Notes (Addendum)
[facility] EMERGENCY DEPARTMENT Provider Note   CSN: [id number] Arrival date & time: 09/17/19  7564     History Chief Complaint  Patient presents with  . Knee Pain    Kyle Black is a 33 y.o. male.  HPI     33 year old immunocompetent male comes into the ER with chief complaint of knee pain.  Patient reports that he started noticing some discomfort in his knee around 3 PM.  Over the course of last 12 hours the pain has significantly gotten worse.  Pain is primarily located in the knee and radiates down to the shin.  Patient also started having chills, and therefore decided to come to the ER.  She was noted to have a low-grade fever here.  Patient has no history of gout.  He denies IV drug use.  He does admit to heavy red meat and beer consumption.  Of note, patient also reports that he was playing with his child, crawling around the house over the last couple of days.  Past Medical History:  Diagnosis Date  . Anxiety and depression     There are no problems to display for this patient.   Past Surgical History:  Procedure Laterality Date  . HAND SURGERY Right 03/2004   right hand fracture       Family History  Problem Relation Age of Onset  . Healthy Mother   . Healthy Father     Social History   Tobacco Use  . Smoking status: Former Smoker    Quit date: 09/26/2012    Years since quitting: 6.9  . Smokeless tobacco: Current User    Types: Chew  Substance Use Topics  . Alcohol use: Yes    Comment: occassionally  . Drug use: No    Home Medications Prior to Admission medications   Medication Sig Start Date End Date Taking? Authorizing Provider  citalopram (CELEXA) 40 MG tablet Take 40 mg by mouth daily.    [provider]  ibuprofen (ADVIL) 800 MG tablet Take 1 tablet (800 mg total) by mouth every 8 (eight) hours as needed. 06/29/19   Mickie Bail, NP    Allergies    Patient has no known allergies.  Review of Systems    Review of Systems  Constitutional: Positive for activity change, chills and fever.  Skin: Positive for rash.  Allergic/Immunologic: Negative for immunocompromised state.  Hematological: Does not bruise/bleed easily.    Physical Exam Updated Vital Signs BP 133/77 (BP Location: Right Arm)   Pulse 86   Temp (!) 100.6 F (38.1 C) (Oral)   Resp 20   Ht 6\' 1"  (1.854 m)   Wt 96.2 kg Comment: from July 2020 records, please update  SpO2 96%   BMI 27.97 kg/m   Physical Exam Vitals and nursing note reviewed.  Constitutional:      Appearance: He is well-developed.  HENT:     Head: Atraumatic.  Cardiovascular:     Rate and Rhythm: Normal rate.  Pulmonary:     Effort: Pulmonary effort is normal.  Musculoskeletal:        General: Swelling and tenderness present.     Cervical back: Neck supple.     Comments: Patient has erythema over the patella and infrapatellar region.  There is induration without fluctuance in the infrapatellar region.  Positive tenderness to palpation.  Positive warmth to touch.  Patient is able to flex the knee about 45 degrees.  Positive discomfort with flexion.  Skin:  General: Skin is warm.     Findings: Rash present.  Neurological:     Mental Status: He is alert and oriented to person, place, and time.     ED Results / Procedures / Treatments   Labs (all labs ordered are listed, but only abnormal results are displayed) Labs Reviewed  CBC WITH DIFFERENTIAL/PLATELET - Abnormal; Notable for the following components:      Result Value   WBC 17.1 (*)    Neutro Abs 14.5 (*)    Monocytes Absolute 1.6 (*)    Abs Immature Granulocytes 0.08 (*)    All other components within normal limits  CULTURE, BLOOD (ROUTINE X 2)  CULTURE, BLOOD (ROUTINE X 2)  BASIC METABOLIC PANEL    EKG None  Radiology DG Knee Complete 4 Views Left  Result Date: 09/17/2019 CLINICAL DATA:  Pain and swelling with redness EXAM: LEFT KNEE - COMPLETE 4+ VIEW COMPARISON:  None.  FINDINGS: No evidence of fracture, dislocation, or joint effusion. There appears to be slight cortical thickening of the posterior distal femoral metadiaphysis which could be from prior cortical desmoid or stress reaction. No evidence of arthropathy or other focal bone abnormality. Mild prepatellar subcutaneous edema is noted. IMPRESSION: No acute osseous abnormality. Electronically Signed   By: Jonna ClarkBindu  Avutu M.D.   On: 09/17/2019 01:45    Procedures Ultrasound ED Soft Tissue  Date/Time: 09/17/2019 4:55 AM Performed by: Derwood KaplanNanavati, Kamil Mchaffie, MD Authorized by: Derwood KaplanNanavati, Delight Bickle, MD   Procedure details:    Indications: limb pain and evaluate for cellulitis     Transverse view:  Visualized   Longitudinal view:  Visualized   Images: archived   Location:    Location: lower extremity     Side:  Left Findings:     no abscess present    cellulitis present    no foreign body present Comments:     No clear evidence of fluid pocket identified.  There is a singular area where it appears that there could be fluid, however patient has declined ultrasound-guided aspiration at that site.   (including critical care time)  Medications Ordered in ED Medications  vancomycin (VANCOREADY) IVPB 2000 mg/400 mL (has no administration in time range)  cefTRIAXone (ROCEPHIN) injection 500 mg (has no administration in time range)  acetaminophen (TYLENOL) tablet 650 mg (650 mg Oral Given 09/17/19 0356)  naproxen (NAPROSYN) tablet 500 mg (500 mg Oral Given 09/17/19 0437)    ED Course  I have reviewed the triage vital signs and the nursing notes.  Pertinent labs & imaging results that were available during my care of the patient were reviewed by me and considered in my medical decision making (see chart for details).    MDM Rules/Calculators/A&P                      33 year old immunocompetent male with heavy red meat and alcohol consumption comes in a chief complaint of knee pain.  He is also been playing with  his child, crawling around the house the last couple of days.  Symptoms have progressed rather rapidly.  Clinically he has septic bursitis.  We did a bedside ultrasound and we do not see any pocket for us to drain.  Patient given the option for attempt to I&D to get definitive diagnosis of gout versus septic bursitis versus starting him on antibiotics and anti-inflammatory.  Patient prefers the latter.  Additionally we informed him that recommendation for septic bursitis is admission to the hospital if he  is having systemic symptoms like fevers and chills, but patient does not want to stay in the hospital.  He reports he will return to the ED if needed.  Patient wants to leave against medical advice. Patient understands that his actions will lead to inadequate medical treatment, and that he is at risk of worsening of his symptoms. Opportunity to change mind given while patient is getting his IV vancomycin.  Discussion witnessed by patient's primary RN Patient is demonstrating good capacity to make decision. Patient understands that he/she needs to return to the ER immediately if his/her symptoms get worse.    Final Clinical Impression(s) / ED Diagnoses Final diagnoses:  Septic bursitis    Rx / DC Orders ED Discharge Orders    None         Varney Biles, MD 09/17/19 [phone]

## 2019-09-17 NOTE — Discharge Instructions (Addendum)
He was seen in the ER for knee pain. We suspect that you likely have septic bursitis.  Other possibility includes gout. At your request we have not attempted to drain the fluid.  At your request we are discharging you rather than admit you to the hospital.  It is prudent that you take the antibiotics prescribed. Return to the ER immediately if your symptoms are progressing.  Use the crutches for support. Keep the leg elevated.  Icing the knee 3 or 4 times a day for 10 minutes each would also be helpful. Follow-up with the sports medicine doctor as requested in 1 week.[facility]

## 2019-09-17 NOTE — ED Notes (Signed)
Pt given dc instructions pt verbalizes understanding.  

## 2019-09-20 ENCOUNTER — Telehealth: Payer: Self-pay | Admitting: *Deleted

## 2019-09-20 NOTE — Telephone Encounter (Signed)
Spoke to pt, he was seen at the ED on 12/25 & was dx with septic bursitis of knee. Pt's fever has subsided but the redness around his knee as spread up his thigh & down his calf. Pt would like to know what you recommend.

## 2019-09-21 NOTE — Telephone Encounter (Signed)
Patient was found to have potentially septic bursitis.  I discussed the possibility of vancomycin for 24 hours but secondary to the overflow in the emergency room sent home on doxycycline and Keflex.  Questionable gout.  With patient being on antibiotics added steroids as well.  Discussed if redness starts to expand needs to seek medical attention immediately.  Patient verbalized understanding

## 2019-09-22 LAB — CULTURE, BLOOD (ROUTINE X 2)
Culture: NO GROWTH
Culture: NO GROWTH
Special Requests: ADEQUATE
Special Requests: ADEQUATE

## 2019-09-23 ENCOUNTER — Other Ambulatory Visit: Payer: Self-pay

## 2019-09-23 ENCOUNTER — Inpatient Hospital Stay (HOSPITAL_BASED_OUTPATIENT_CLINIC_OR_DEPARTMENT_OTHER)
Admission: EM | Admit: 2019-09-23 | Discharge: 2019-09-25 | DRG: 501 | Disposition: A | Discharge status: Home / Self Care | Payer: No Typology Code available for payment source | Attending: Orthopaedic Surgery | Admitting: Orthopaedic Surgery

## 2019-09-23 ENCOUNTER — Encounter (HOSPITAL_BASED_OUTPATIENT_CLINIC_OR_DEPARTMENT_OTHER): Payer: Self-pay | Admitting: Emergency Medicine

## 2019-09-23 DIAGNOSIS — M7040 Prepatellar bursitis, unspecified knee: Secondary | ICD-10-CM | POA: Diagnosis present

## 2019-09-23 DIAGNOSIS — F329 Major depressive disorder, single episode, unspecified: Secondary | ICD-10-CM | POA: Diagnosis present

## 2019-09-23 DIAGNOSIS — F419 Anxiety disorder, unspecified: Secondary | ICD-10-CM | POA: Diagnosis present

## 2019-09-23 DIAGNOSIS — M009 Pyogenic arthritis, unspecified: Principal | ICD-10-CM | POA: Diagnosis present

## 2019-09-23 DIAGNOSIS — Z791 Long term (current) use of non-steroidal anti-inflammatories (NSAID): Secondary | ICD-10-CM

## 2019-09-23 DIAGNOSIS — M71161 Other infective bursitis, right knee: Secondary | ICD-10-CM

## 2019-09-23 DIAGNOSIS — Z79899 Other long term (current) drug therapy: Secondary | ICD-10-CM

## 2019-09-23 DIAGNOSIS — M7042 Prepatellar bursitis, left knee: Secondary | ICD-10-CM | POA: Diagnosis present

## 2019-09-23 DIAGNOSIS — M71162 Other infective bursitis, left knee: Secondary | ICD-10-CM | POA: Diagnosis not present

## 2019-09-23 DIAGNOSIS — F17220 Nicotine dependence, chewing tobacco, uncomplicated: Secondary | ICD-10-CM | POA: Diagnosis present

## 2019-09-23 DIAGNOSIS — L03116 Cellulitis of left lower limb: Secondary | ICD-10-CM | POA: Diagnosis present

## 2019-09-23 DIAGNOSIS — B9689 Other specified bacterial agents as the cause of diseases classified elsewhere: Secondary | ICD-10-CM | POA: Diagnosis not present

## 2019-09-23 DIAGNOSIS — Z20822 Contact with and (suspected) exposure to covid-19: Secondary | ICD-10-CM | POA: Diagnosis present

## 2019-09-23 LAB — COMPREHENSIVE METABOLIC PANEL
ALT: 29 U/L (ref 0–44)
AST: 29 U/L (ref 15–41)
Albumin: 4.1 g/dL (ref 3.5–5.0)
Alkaline Phosphatase: 80 U/L (ref 38–126)
Anion gap: 9 (ref 5–15)
BUN: 15 mg/dL (ref 6–20)
CO2: 27 mmol/L (ref 22–32)
Calcium: 9.6 mg/dL (ref 8.9–10.3)
Chloride: 102 mmol/L (ref 98–111)
Creatinine, Ser: 0.83 mg/dL (ref 0.61–1.24)
GFR calc Af Amer: >60 mL/min (ref >60)
GFR calc non Af Amer: >60 mL/min (ref >60)
Glucose, Bld: 96 mg/dL (ref 70–99)
Potassium: 5 mmol/L (ref 3.5–5.1)
Sodium: 138 mmol/L (ref 135–145)
Total Bilirubin: 0.7 mg/dL (ref 0.3–1.2)
Total Protein: 8 g/dL (ref 6.5–8.1)

## 2019-09-23 LAB — CBC WITH DIFFERENTIAL/PLATELET
Abs Immature Granulocytes: 0.1 10*3/uL — ABNORMAL HIGH (ref 0.00–0.07)
Basophils Absolute: 0 10*3/uL (ref 0.0–0.1)
Basophils Relative: 1 %
Eosinophils Absolute: 0.2 10*3/uL (ref 0.0–0.5)
Eosinophils Relative: 2 %
HCT: 41.6 % (ref 39.0–52.0)
Hemoglobin: 13.9 g/dL (ref 13.0–17.0)
Immature Granulocytes: 1 %
Lymphocytes Relative: 15 %
Lymphs Abs: 1.2 10*3/uL (ref 0.7–4.0)
MCH: 30.9 pg (ref 26.0–34.0)
MCHC: 33.4 g/dL (ref 30.0–36.0)
MCV: 92.4 fL (ref 80.0–100.0)
Monocytes Absolute: 0.9 10*3/uL (ref 0.1–1.0)
Monocytes Relative: 10 %
Neutro Abs: 5.8 10*3/uL (ref 1.7–7.7)
Neutrophils Relative %: 71 %
Platelets: 305 10*3/uL (ref 150–400)
RBC: 4.5 MIL/uL (ref 4.22–5.81)
RDW: 11.8 % (ref 11.5–15.5)
WBC: 8.2 10*3/uL (ref 4.0–10.5)
nRBC: 0 % (ref 0.0–0.2)

## 2019-09-23 LAB — SARS CORONAVIRUS 2 (TAT 6-24 HRS): SARS Coronavirus 2: NEGATIVE

## 2019-09-23 MED ORDER — HYDROCODONE-ACETAMINOPHEN 5-325 MG PO TABS
1.0000 | ORAL_TABLET | ORAL | Status: DC | PRN
  Administered 2019-09-24 (×3): 1 via ORAL
  Filled 2019-09-23: qty 1
  Filled 2019-09-23: qty 2
  Filled 2019-09-23: qty 1

## 2019-09-23 MED ORDER — HYDROMORPHONE HCL 1 MG/ML IJ SOLN: 1.0000 mg | INTRAMUSCULAR | Status: DC | PRN

## 2019-09-23 MED ORDER — POLYETHYLENE GLYCOL 3350 17 G PO PACK: 17.0000 g | PACK | Freq: Every day | ORAL | Status: DC | PRN

## 2019-09-23 MED ORDER — ONDANSETRON HCL 4 MG PO TABS: 4.0000 mg | ORAL_TABLET | Freq: Four times a day (QID) | ORAL | Status: DC | PRN

## 2019-09-23 MED ORDER — ACETAMINOPHEN 325 MG PO TABS
650.0000 mg | ORAL_TABLET | Freq: Four times a day (QID) | ORAL | Status: DC | PRN
  Administered 2019-09-23 – 2019-09-24 (×2): 650 mg via ORAL
  Filled 2019-09-23 (×2): qty 2

## 2019-09-23 MED ORDER — CITALOPRAM HYDROBROMIDE 20 MG PO TABS: 40.0000 mg | ORAL_TABLET | Freq: Every day | ORAL | Status: DC

## 2019-09-23 MED ORDER — CITALOPRAM HYDROBROMIDE 20 MG PO TABS
40.0000 mg | ORAL_TABLET | Freq: Every day | ORAL | Status: DC
  Administered 2019-09-24: 40 mg via ORAL
  Filled 2019-09-23: qty 2

## 2019-09-23 MED ORDER — VANCOMYCIN HCL 2000 MG/400ML IV SOLN
2000.0000 mg | Freq: Two times a day (BID) | INTRAVENOUS | Status: DC
  Administered 2019-09-23 – 2019-09-24 (×2): 2000 mg via INTRAVENOUS
  Filled 2019-09-23 (×3): qty 400

## 2019-09-23 MED ORDER — SODIUM CHLORIDE 0.9% FLUSH
3.0000 mL | Freq: Two times a day (BID) | INTRAVENOUS | Status: DC
  Administered 2019-09-23 – 2019-09-24 (×3): 3 mL via INTRAVENOUS

## 2019-09-23 MED ORDER — SODIUM CHLORIDE 0.9 % IV SOLN
1.0000 g | Freq: Once | INTRAVENOUS | Status: AC
  Administered 2019-09-23: 1 g via INTRAVENOUS
  Filled 2019-09-23: qty 10

## 2019-09-23 MED ORDER — SODIUM CHLORIDE 0.9 % IV SOLN
INTRAVENOUS | Status: DC | PRN
  Administered 2019-09-23: 500 mL via INTRAVENOUS

## 2019-09-23 MED ORDER — ACETAMINOPHEN 650 MG RE SUPP: 650.0000 mg | Freq: Four times a day (QID) | RECTAL | Status: DC | PRN

## 2019-09-23 MED ORDER — VANCOMYCIN HCL IN DEXTROSE 1-5 GM/200ML-% IV SOLN
INTRAVENOUS | Status: AC
  Filled 2019-09-23: qty 400

## 2019-09-23 MED ORDER — ONDANSETRON HCL 4 MG/2ML IJ SOLN: 4.0000 mg | Freq: Four times a day (QID) | INTRAMUSCULAR | Status: DC | PRN

## 2019-09-23 MED ORDER — SODIUM CHLORIDE 0.9 % IV SOLN: 1.0000 g | Freq: Once | INTRAVENOUS | Status: DC

## 2019-09-23 MED ORDER — SODIUM CHLORIDE 0.9 % IV SOLN
1.0000 g | INTRAVENOUS | Status: DC
  Administered 2019-09-24: 1 g via INTRAVENOUS
  Filled 2019-09-23: qty 1

## 2019-09-23 MED ORDER — CELECOXIB 200 MG PO CAPS
400.0000 mg | ORAL_CAPSULE | Freq: Once | ORAL | Status: AC
  Administered 2019-09-23: 400 mg via ORAL
  Filled 2019-09-23: qty 2

## 2019-09-23 MED ORDER — ACETAMINOPHEN 500 MG PO TABS: 1000.0000 mg | ORAL_TABLET | Freq: Once | ORAL | Status: DC

## 2019-09-23 NOTE — Consult Note (Signed)
Reason for Consult:  Left knee pre-patella bursa infection Referring Physician:  [facility] EDP  Kyle Black is an 34 y.o. male.  HPI: The patient is an otherwise healthy 34 year old gentleman who developed left knee pain over the patella tendon on Christmas Day.  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  As the day progressed he started getting worsening knee pain.  He thought he may have just hit it against the wooden floor hard.  He then developed redness around the prepatellar area as well as fever and chills.  He was seen in the emergency room by the ER staff at [facility].  He was given a dose of IV antibiotics and then placed on oral antibiotics.  He then presented to [facility] today with worsening right knee pain and redness.  He was transferred to [facility] to be started on IV antibiotics with orthopedic surgery consulting as to whether or not the patient's knee warrant surgery.  He otherwise does not appear to be immunocompromised.  I spoke to him with in length about his left knee and was able to assess it.  He denies any other illnesses.  He denies any headache, chest pain, shortness of breath, fever, chills, nausea, vomiting.  Past Medical History:  Diagnosis Date  . Anxiety and depression     Past Surgical History:  Procedure Laterality Date  . HAND SURGERY Right 03/2004   right hand fracture    Family History  Problem Relation Age of Onset  . Healthy Mother   . Healthy Father     Social History:  reports that he quit smoking about 6 years ago. His smokeless tobacco use includes chew. He reports current alcohol use. He reports that he does not use drugs.  Allergies: No Known Allergies  Medications: I have reviewed the patient's current medications.  Results for orders placed or performed during the hospital encounter of 09/23/19 (from the past 48 hour(s))  CBC with  Differential     Status: Abnormal   Collection Time: 09/23/19 12:17 PM  Result Value Ref Range   WBC 8.2 4.0 - 10.5 K/uL   RBC 4.50 4.22 - 5.81 MIL/uL   Hemoglobin 13.9 13.0 - 17.0 g/dL   HCT 17.5 10.2 - 58.5 %   MCV 92.4 80.0 - 100.0 fL   MCH 30.9 26.0 - 34.0 pg   MCHC 33.4 30.0 - 36.0 g/dL   RDW 27.7 82.4 - 23.5 %   Platelets 305 150 - 400 K/uL   nRBC 0.0 0.0 - 0.2 %   Neutrophils Relative % 71 %   Neutro Abs 5.8 1.7 - 7.7 K/uL   Lymphocytes Relative 15 %   Lymphs Abs 1.2 0.7 - 4.0 K/uL   Monocytes Relative 10 %   Monocytes Absolute 0.9 0.1 - 1.0 K/uL   Eosinophils Relative 2 %   Eosinophils Absolute 0.2 0.0 - 0.5 K/uL   Basophils Relative 1 %   Basophils Absolute 0.0 0.0 - 0.1 K/uL   Immature Granulocytes 1 %   Abs Immature Granulocytes 0.10 (H) 0.00 - 0.07 K/uL    Comment: Performed at [facility], [address] [place], [place] [address]  Comprehensive metabolic panel     Status: None   Collection Time: 09/23/19 12:17 PM  Result Value Ref Range   Sodium 138 135 - 145 mmol/L   Potassium 5.0  3.5 - 5.1 mmol/L   Chloride 102 98 - 111 mmol/L   CO2 27 22 - 32 mmol/L   Glucose, Bld 96 70 - 99 mg/dL   BUN 15 6 - 20 mg/dL   Creatinine, Ser 0.83 0.61 - 1.24 mg/dL   Calcium 9.6 8.9 - 10.3 mg/dL   Total Protein 8.0 6.5 - 8.1 g/dL   Albumin 4.1 3.5 - 5.0 g/dL   AST 29 15 - 41 U/L   ALT 29 0 - 44 U/L   Alkaline Phosphatase 80 38 - 126 U/L   Total Bilirubin 0.7 0.3 - 1.2 mg/dL   GFR calc non Af Amer >60 >60 mL/min   GFR calc Af Amer >60 >60 mL/min   Anion gap 9 5 - 15    Comment: Performed at [facility], [address] [place], [place] [address]    No results found.  Review of Systems Blood pressure (!) 142/97, pulse 62, temperature 98.3 F (36.8 C), temperature source Oral, resp. rate 16, height 6\' 1"  (1.854 m), weight 97.4 kg, SpO2 100 %. Physical Exam  Constitutional: He is oriented to person, place, and time. He appears well-developed and  well-nourished.  HENT:  Head: Normocephalic and atraumatic.  Eyes: Pupils are equal, round, and reactive to light. EOM are normal.  Cardiovascular: Normal rate.  Respiratory: Effort normal.  GI: Soft.  Musculoskeletal:     Cervical back: Normal range of motion.     Left knee: Swelling present. Tenderness present over the patellar tendon.       Legs:  Neurological: He is alert and oriented to person, place, and time.  Skin: Skin is warm and dry.  Psychiatric: He has a normal mood and affect.    Assessment/Plan: Left knee prepatellar bursa infection with associated cellulitis  Since this is been unable to be cleared with just a dose of IV antibiotics as well as oral antibiotics, I would recommend an irrigation and debridement of the prepatellar bursa area and the operating room.  The surgery would only take about 15 minutes.  I explained to the patient will be done through about it 2 inch incision and we will just irrigate fluid through the bursa to washed out thoroughly.  He does need to be on intravenous antibiotics for the next 24 hours and then hopefully transition to oral antibiotics after that with being able to be discharged to home tomorrow evening versus Sunday morning.  He is scheduled for surgery for first thing in the morning tomorrow morning.  His Covid 19 test is pending.  The swab was done at [place] believe.  I explained in detail to the patient what surgery involves.  I will order regular diet and some pain medications and will have him n.p.o. after midnight tonight in anticipation of surgery first thing in the morning tomorrow morning.  Mcarthur Rossetti 09/23/2019, 5:09 PM

## 2019-09-23 NOTE — Progress Notes (Signed)
Pharmacy Antibiotic Note  Kyle Black is a 34 y.o. male admitted on 09/23/2019 with worsening knee infection.  Patient was seen in late December and discharged on oral antibiotic.  Pharmacy has been consulted for vancomycin dosing for septic bursitis.  SCr 0.83, CrCL > 100 ml/min, afebrile, WBC WNL.  Plan: Vanc 2gm IV Q12H for AUC 471 using SCr 0.83 Monitor renal fxn, clinical progress, vanc AUC at Css  If continue Rocephin, recommend 2gm IV Q24H   Height: 6\' 1"  (185.4 cm) Weight: 214 lb 12.8 oz (97.4 kg) IBW/kg (Calculated) : 79.9  Temp (24hrs), Avg:98.3 F (36.8 C), Min:98.3 F (36.8 C), Max:98.3 F (36.8 C)  Recent Labs  Lab 09/17/19 0424 09/23/19 1217  WBC 17.1* 8.2  CREATININE 1.18 0.83    Estimated Creatinine Clearance: 152.7 mL/min (by C-G formula based on SCr of 0.83 mg/dL).    No Known Allergies  Vanc 1/1 >> CTX x1 dose 1/11  1/1 covid - 1/1 BCx -   Kyle Black D. 10-29-1984, PharmD, BCPS, BCCCP 09/23/2019, 1:27 PM

## 2019-09-23 NOTE — H&P (Signed)
History and Physical  Kyle Black WUJ:[id number] DOB: [birth year] DOA: 09/23/2019   Patient coming from: Home & is able to ambulate  Chief Complaint: Left knee infection  HPI: Kyle Black is a 34 y.o. male with medical history significant for anxiety/depression, presents to the ED complaining of worsening pain, swelling, erythema on the anterior portion of his left knee for the past 1 week.  Patient was seen in the ER on 09/17/2019, of which there was concern for possible gout versus septic bursitis.  Patient was given IV vancomycin, and signed AMA after receiving just 1 dose.  Patient was given a prescription for Keflex and doxycycline of which patient reported that he filled and took it, without any significant improvement in symptoms. Patient currently denies any similar condition in the past, denies any fever/chills, diarrhea, abdominal pain, chest pain, shortness of breath, recent fall or insect bites.  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 Patient was seen at [facility] ED, vital signs stable, labs unremarkable.  Case was discussed with orthopedics Dr. Ninfa Linden, recommended admission for I&D, and IV antibiotics, due to failed outpatient therapy.  COVID-19 virus test pending.  Hospitalist asked to admit patient.   Review of Systems: Review of systems are otherwise negative   Past Medical History:  Diagnosis Date  . Anxiety and depression    Past Surgical History:  Procedure Laterality Date  . HAND SURGERY Right 03/2004   right hand fracture    Social History:  reports that he quit smoking about 6 years ago. His smokeless tobacco use includes chew. He reports current alcohol use. He reports that he does not use drugs.   No Known Allergies  Family History  Problem Relation Age of Onset  . Healthy Mother   . Healthy Father       Prior to Admission medications   Medication Sig Start Date End Date Taking?  Authorizing Provider  cephALEXin (KEFLEX) 500 MG capsule Take 1 capsule (500 mg total) by mouth 4 (four) times daily. 09/17/19  Yes Varney Biles, MD  citalopram (CELEXA) 40 MG tablet Take 40 mg by mouth daily.   Yes [provider]  doxycycline (VIBRAMYCIN) 100 MG capsule Take 1 capsule (100 mg total) by mouth 2 (two) times daily. 09/17/19  Yes Varney Biles, MD  naproxen (NAPROSYN) 375 MG tablet Take 1 tablet (375 mg total) by mouth 2 (two) times daily. 09/17/19  Yes Varney Biles, MD  acetaminophen (TYLENOL 8 HOUR) 650 MG CR tablet Take 1 tablet (650 mg total) by mouth every 8 (eight) hours as needed for pain or fever. Patient not taking: Reported on 09/23/2019 09/17/19   Varney Biles, MD  ibuprofen (ADVIL) 800 MG tablet Take 1 tablet (800 mg total) by mouth every 8 (eight) hours as needed. Patient not taking: Reported on 09/23/2019 06/29/19   Sharion Balloon, NP    Physical Exam: BP (!) 142/97 (BP Location: Right Arm)   Pulse 62   Temp 98.3 F (36.8 C) (Oral)   Resp 16   Ht 6\' 1"  (1.854 m)   Wt 97.4 kg   SpO2 100%   BMI 28.34 kg/m   General: NAD Eyes: Normal ENT: Normal Neck: Supple Cardiovascular: S1, S2 present Respiratory: CTAB Abdomen: Soft, nontender, nondistended, bowel sounds present Skin: As noted below Musculoskeletal: Left knee erythema, tenderness, with some minimal fluctuance present.  Able to flex and extend his knee without any significant difficulty Psychiatric: Normal mood Neurologic:  No obvious focal neurologic deficits noted          Labs on Admission:  Basic Metabolic Panel: Recent Labs  Lab 09/17/19 0424 09/23/19 1217  NA 136 138  K 4.1 5.0  CL 99 102  CO2 25 27  GLUCOSE 120* 96  BUN 15 15  CREATININE 1.18 0.83  CALCIUM 8.7* 9.6   Liver Function Tests: Recent Labs  Lab 09/23/19 1217  AST 29  ALT 29  ALKPHOS 80  BILITOT 0.7  PROT 8.0  ALBUMIN 4.1   No results for input(s): LIPASE, AMYLASE in the last 168 hours. No  results for input(s): AMMONIA in the last 168 hours. CBC: Recent Labs  Lab 09/17/19 0424 09/23/19 1217  WBC 17.1* 8.2  NEUTROABS 14.5* 5.8  HGB 14.9 13.9  HCT 43.1 41.6  MCV 90.2 92.4  PLT 213 305   Cardiac Enzymes: No results for input(s): CKTOTAL, CKMB, CKMBINDEX, TROPONINI in the last 168 hours.  BNP (last 3 results) No results for input(s): BNP in the last 8760 hours.  ProBNP (last 3 results) No results for input(s): PROBNP in the last 8760 hours.  CBG: No results for input(s): GLUCAP in the last 168 hours.  Radiological Exams on Admission: No results found.  EKG: Pending  Assessment/Plan Present on Admission: . Prepatellar bursitis  Active Problems:   Prepatellar bursitis   Septic infrapatellar bursitis of left knee   Left knee prepatella bursitis with associated cellulitis Currently afebrile, with no leukocytosis Left knee x-ray done on 09/17/2019 showed mild prepatellar subcutaneous edema, with no evidence of fracture, dislocation, or joint effusion, no focal bone abnormality or evidence of arthropathy  Orthopedics on board: Plan for I&D on 09/24/2019, IV antibiotics Continue IV Vanco, cefepime Monitor closely  Depression/anxiety Continue home Celexa      DVT prophylaxis: SCD for now due to possible I&D  Code Status: Full  Family Communication: None at bedside  Disposition Plan: Likely home  Consults called: Orthopedics  Admission status: Inpatient    Briant Cedar MD [facility]   If 7PM-7AM, please contact night-coverage [email or web address]  09/23/2019, 5:28 PM

## 2019-09-23 NOTE — ED Provider Notes (Signed)
[facility] EMERGENCY DEPARTMENT Provider Note   CSN: [id number] Arrival date & time: 09/23/19  1044     History Chief Complaint  Patient presents with  . Knee infection spreading    Kyle Black is a 34 y.o. male history of anxiety depression otherwise healthy.  Patient presents today for worsening pain swelling and erythema of the anterior left knee.  Patient reports symptoms began on 09/16/2019 and have continued to worsen since that time.  He describes moderate burning pain constant worse with palpation improved with rest, no radiation.  Patient was seen in the ER on 09/17/2019 work-up at that time included ultrasound without clear evidence of fluid pocket, a single area where there might have been fluid, patient had declined ultrasound-guided aspiration.,  There was concern for gout versus septic bursitis.  Patient left AMA after receiving 1 dose IV vancomycin was given rx for Keflex and doxycycline.  He reports that he has been compliant with antibiotic regiment since he left the hospital but symptoms have continued to worsen.  Patient also obtained an unknown steroid as there is a thought that his symptoms may be due to gout he reports that he took the steroid medication p.o. and his symptoms have not improved.  He denies fever, headache, neck pain, chest pain, difficulty breathing, abdominal pain, nausea/vomiting, fever/chills, numbness/weakness, tingling or any additional concerns. HPI     Past Medical History:  Diagnosis Date  . Anxiety and depression     Patient Active Problem List   Diagnosis Date Noted  . Prepatellar bursitis 09/23/2019    Past Surgical History:  Procedure Laterality Date  . HAND SURGERY Right 03/2004   right hand fracture       Family History  Problem Relation Age of Onset  . Healthy Mother   . Healthy Father     Social History   Tobacco Use  . Smoking status: Former Smoker    Quit date: 09/26/2012    Years since quitting:  6.9  . Smokeless tobacco: Current User    Types: Chew  Substance Use Topics  . Alcohol use: Yes    Comment: occassionally  . Drug use: No    Home Medications Prior to Admission medications   Medication Sig Start Date End Date Taking? Authorizing Provider  acetaminophen (TYLENOL 8 HOUR) 650 MG CR tablet Take 1 tablet (650 mg total) by mouth every 8 (eight) hours as needed for pain or fever. 09/17/19   Varney Biles, MD  cephALEXin (KEFLEX) 500 MG capsule Take 1 capsule (500 mg total) by mouth 4 (four) times daily. 09/17/19   Varney Biles, MD  citalopram (CELEXA) 40 MG tablet Take 40 mg by mouth daily.    [provider]  doxycycline (VIBRAMYCIN) 100 MG capsule Take 1 capsule (100 mg total) by mouth 2 (two) times daily. 09/17/19   Varney Biles, MD  ibuprofen (ADVIL) 800 MG tablet Take 1 tablet (800 mg total) by mouth every 8 (eight) hours as needed. 06/29/19   Sharion Balloon, NP  naproxen (NAPROSYN) 375 MG tablet Take 1 tablet (375 mg total) by mouth 2 (two) times daily. 09/17/19   Varney Biles, MD    Allergies    Patient has no known allergies.  Review of Systems   Review of Systems Ten systems are reviewed and are negative for acute change except as noted in the HPI  Physical Exam Updated Vital Signs BP (!) 142/97 (BP Location: Right Arm)   Pulse 62   Temp 98.3  F (36.8 C) (Oral)   Resp 16   Ht 6\' 1"  (1.854 m)   Wt 97.4 kg   SpO2 100%   BMI 28.34 kg/m   Physical Exam Constitutional:      General: He is not in acute distress.    Appearance: Normal appearance. He is well-developed. He is not ill-appearing or diaphoretic.  HENT:     Head: Normocephalic and atraumatic.     Right Ear: External ear normal.     Left Ear: External ear normal.     Nose: Nose normal.  Eyes:     General: Vision grossly intact. Gaze aligned appropriately.     Pupils: Pupils are equal, round, and reactive to light.  Neck:     Trachea: Trachea and phonation normal. No tracheal  deviation.  Cardiovascular:     Pulses:          Dorsalis pedis pulses are 2+ on the left side.  Pulmonary:     Effort: Pulmonary effort is normal. No respiratory distress.  Abdominal:     General: There is no distension.     Palpations: Abdomen is soft.     Tenderness: There is no abdominal tenderness. There is no guarding or rebound.  Musculoskeletal:        General: Normal range of motion.     Cervical back: Normal range of motion.     Comments: Large area of erythema just below patient's left patella consistent.  Has extended beyond all borders of previous demarcation.  Tender to palpation, small amount of fluctuance present.  He is able to flex and extend his knee without difficulty and only minimal increase in pain.  Feet:     Left foot:     Protective Sensation: 5 sites tested. 5 sites sensed.  Skin:    General: Skin is warm and dry.  Neurological:     Mental Status: He is alert.     GCS: GCS eye subscore is 4. GCS verbal subscore is 5. GCS motor subscore is 6.     Comments: Speech is clear and goal oriented, follows commands Major Cranial nerves without deficit, no facial droop Moves extremities without ataxia, coordination intact  Psychiatric:        Behavior: Behavior normal.       ED Results / Procedures / Treatments   Labs (all labs ordered are listed, but only abnormal results are displayed) Labs Reviewed  CBC WITH DIFFERENTIAL/PLATELET - Abnormal; Notable for the following components:      Result Value   Abs Immature Granulocytes 0.10 (*)    All other components within normal limits  CULTURE, BLOOD (ROUTINE X 2)  CULTURE, BLOOD (ROUTINE X 2)  SARS CORONAVIRUS 2 (TAT 6-24 HRS)  COMPREHENSIVE METABOLIC PANEL    EKG None  Radiology No results found.  Procedures Procedures (including critical care time)  Medications Ordered in ED Medications  0.9 %  sodium chloride infusion (500 mLs Intravenous New Bag/Given 09/23/19 1320)  vancomycin (VANCOREADY)  IVPB 2000 mg/400 mL (2,000 mg Intravenous New Bag/Given 09/23/19 1402)  vancomycin (VANCOCIN) 1-5 GM/200ML-% IVPB (has no administration in time range)  cefTRIAXone (ROCEPHIN) 1 g in sodium chloride 0.9 % 100 mL IVPB ( Intravenous Stopped 09/23/19 1354)    ED Course  I have reviewed the triage vital signs and the nursing notes.  Pertinent labs & imaging results that were available during my care of the patient were reviewed by me and considered in my medical decision making (see chart for  details).  Clinical Course as of Sep 22 1400  Fri Sep 23, 2019  1226 Dr. Magnus Ivan, hospital admit and decide IV abx, will see at either hospital for I&D.   [BM]  1300 Dr. Ronaldo Miyamoto   [BM]    Clinical Course User Index [BM] Elizabeth Palau   MDM Rules/Calculators/A&P                     35 year old male returns for worsening of erythema, swelling and pain of the left knee.  Clinically appears to have a septic prepatellar bursitis.  He does not have any systemic symptoms.  Additionally he is able to move his knee freely with minimal increase in pain, I doubt this to be a septic arthritis at this time.  Will obtain lab work and consult orthopedist further recommendations.  Appears patient has failed outpatient treatment, he is agreeable for admission and reports he does not plan to leave AMA. - 12:26 PM: Discussed case with orthopedist Dr. Magnus Ivan.  Advises may admit patient to hospitalist service, Dr. Magnus Ivan will see patient in hospital for incision and drainage.  No preference from orthopedist regarding if [facility] or [facility] admission.  Antibiotic choices deferred to hospitalist service.  Dr. Magnus Ivan requests call once patient arrived to hospital. - CBC nonacute, no leukocytosis CMP within normal limits Blood cultures pending Screening Covid test pending - Discussed case with hospitalist Dr. Ronaldo Miyamoto who has accepted patient for admission to his service.  Agrees with antibiotic choices  of vancomycin and Rocephin.  [facility] hospital. - Patient reassessed resting comfortably no acute distress vital signs stable no tachycardia or hypoxia on room air.  He states understanding of care plan and is agreeable for admission.  Patient's case discussed with Dr. Deretha Emory who agrees with plan.  Note: Portions of this report may have been transcribed using voice recognition software. Every effort was made to ensure accuracy; however, inadvertent computerized transcription errors may still be present.  Final Clinical Impression(s) / ED Diagnoses Final diagnoses:  Septic infrapatellar bursitis, right    Rx / DC Orders ED Discharge Orders    None       Arvie, Bartholomew 09/23/19 1403    Vanetta Mulders, MD 10/02/19 [phone]

## 2019-09-23 NOTE — ED Notes (Signed)
ED Provider at bedside. 

## 2019-09-23 NOTE — ED Triage Notes (Signed)
Was diagnosed Left knee Septic Bursitis on 12/26.  Has been taking abx ever since but the redness and swelling are spreading.

## 2019-09-23 NOTE — ED Notes (Signed)
Pt provided food brought by girlfriend

## 2019-09-23 NOTE — Anesthesia Preprocedure Evaluation (Addendum)
Anesthesia Evaluation  Patient identified by MRN, date of birth, ID band Patient awake    Reviewed: Allergy & Precautions, NPO status , Patient's Chart, lab work & pertinent test results  History of Anesthesia Complications (+) PONV and history of anesthetic complications  Airway Mallampati: II  TM Distance: >3 FB Neck ROM: Full    Dental no notable dental hx. (+) Dental Advisory Given   Pulmonary neg pulmonary ROS, former smoker,    Pulmonary exam normal        Cardiovascular negative cardio ROS Normal cardiovascular exam     Neuro/Psych PSYCHIATRIC DISORDERS Anxiety Depression negative neurological ROS     GI/Hepatic negative GI ROS, Neg liver ROS,   Endo/Other  negative endocrine ROS  Renal/GU negative Renal ROS     Musculoskeletal   Abdominal   Peds  Hematology negative hematology ROS (+)   Anesthesia Other Findings Day of surgery medications reviewed with the patient.  Reproductive/Obstetrics                            Anesthesia Physical Anesthesia Plan  ASA: II  Anesthesia Plan: General   Post-op Pain Management:    Induction: Intravenous  PONV Risk Score and Plan: Ondansetron, Midazolam and Treatment may vary due to age or medical condition  Airway Management Planned: LMA  Additional Equipment:   Intra-op Plan:   Post-operative Plan: Extubation in OR  Informed Consent: I have reviewed the patients History and Physical, chart, labs and discussed the procedure including the risks, benefits and alternatives for the proposed anesthesia with the patient or authorized representative who has indicated his/her understanding and acceptance.     Dental advisory given  Plan Discussed with: Anesthesiologist, CRNA and Surgeon  Anesthesia Plan Comments:        Anesthesia Quick Evaluation

## 2019-09-24 ENCOUNTER — Encounter (HOSPITAL_COMMUNITY)
Admission: EM | Disposition: A | Discharge status: Home / Self Care | Payer: Self-pay | Source: Home / Self Care | Attending: Internal Medicine

## 2019-09-24 ENCOUNTER — Encounter (HOSPITAL_COMMUNITY): Payer: Self-pay | Admitting: Internal Medicine

## 2019-09-24 ENCOUNTER — Inpatient Hospital Stay (HOSPITAL_COMMUNITY): Payer: No Typology Code available for payment source | Admitting: Anesthesiology

## 2019-09-24 HISTORY — PX: IRRIGATION AND DEBRIDEMENT KNEE: SHX5185

## 2019-09-24 LAB — COMPREHENSIVE METABOLIC PANEL
ALT: 25 U/L (ref 0–44)
AST: 24 U/L (ref 15–41)
Albumin: 3.3 g/dL — ABNORMAL LOW (ref 3.5–5.0)
Alkaline Phosphatase: 67 U/L (ref 38–126)
Anion gap: 10 (ref 5–15)
BUN: 20 mg/dL (ref 6–20)
CO2: 25 mmol/L (ref 22–32)
Calcium: 9 mg/dL (ref 8.9–10.3)
Chloride: 105 mmol/L (ref 98–111)
Creatinine, Ser: 0.87 mg/dL (ref 0.61–1.24)
GFR calc Af Amer: >60 mL/min (ref >60)
GFR calc non Af Amer: >60 mL/min (ref >60)
Glucose, Bld: 93 mg/dL (ref 70–99)
Potassium: 4.1 mmol/L (ref 3.5–5.1)
Sodium: 140 mmol/L (ref 135–145)
Total Bilirubin: 0.3 mg/dL (ref 0.3–1.2)
Total Protein: 6.9 g/dL (ref 6.5–8.1)

## 2019-09-24 LAB — CBC
HCT: 39.5 % (ref 39.0–52.0)
Hemoglobin: 13 g/dL (ref 13.0–17.0)
MCH: 30.2 pg (ref 26.0–34.0)
MCHC: 32.9 g/dL (ref 30.0–36.0)
MCV: 91.6 fL (ref 80.0–100.0)
Platelets: 304 10*3/uL (ref 150–400)
RBC: 4.31 MIL/uL (ref 4.22–5.81)
RDW: 11.7 % (ref 11.5–15.5)
WBC: 8.7 10*3/uL (ref 4.0–10.5)
nRBC: 0 % (ref 0.0–0.2)

## 2019-09-24 LAB — HIV ANTIBODY (ROUTINE TESTING W REFLEX): HIV Screen 4th Generation wRfx: NONREACTIVE

## 2019-09-24 SURGERY — IRRIGATION AND DEBRIDEMENT KNEE
Anesthesia: General | Site: Knee | Laterality: Left

## 2019-09-24 MED ORDER — PROPOFOL 10 MG/ML IV BOLUS
INTRAVENOUS | Status: DC | PRN
  Administered 2019-09-24: 200 mg via INTRAVENOUS

## 2019-09-24 MED ORDER — DEXAMETHASONE SODIUM PHOSPHATE 10 MG/ML IJ SOLN
INTRAMUSCULAR | Status: AC
  Filled 2019-09-24: qty 1

## 2019-09-24 MED ORDER — DEXAMETHASONE SODIUM PHOSPHATE 10 MG/ML IJ SOLN
INTRAMUSCULAR | Status: DC | PRN
  Administered 2019-09-24: 10 mg via INTRAVENOUS

## 2019-09-24 MED ORDER — FENTANYL CITRATE (PF) 100 MCG/2ML IJ SOLN: 25.0000 ug | INTRAMUSCULAR | Status: DC | PRN

## 2019-09-24 MED ORDER — BUPIVACAINE HCL (PF) 0.25 % IJ SOLN
INTRAMUSCULAR | Status: AC
  Filled 2019-09-24: qty 30

## 2019-09-24 MED ORDER — LIDOCAINE HCL (CARDIAC) PF 50 MG/5ML IV SOSY
PREFILLED_SYRINGE | INTRAVENOUS | Status: DC | PRN
  Administered 2019-09-24: 100 mg via INTRAVENOUS
  Administered 2019-09-24: 50 mg via INTRAVENOUS
  Administered 2019-09-24: 100 mg via INTRAVENOUS

## 2019-09-24 MED ORDER — 0.9 % SODIUM CHLORIDE (POUR BTL) OPTIME
TOPICAL | Status: DC | PRN
  Administered 2019-09-24: 1000 mL

## 2019-09-24 MED ORDER — ACETAMINOPHEN 10 MG/ML IV SOLN
INTRAVENOUS | Status: AC
  Filled 2019-09-24: qty 100

## 2019-09-24 MED ORDER — PROMETHAZINE HCL 25 MG/ML IJ SOLN: 6.2500 mg | INTRAMUSCULAR | Status: DC | PRN

## 2019-09-24 MED ORDER — DIPHENHYDRAMINE HCL 50 MG/ML IJ SOLN: 25.0000 mg | Freq: Four times a day (QID) | INTRAMUSCULAR | Status: DC | PRN

## 2019-09-24 MED ORDER — ONDANSETRON HCL 4 MG/2ML IJ SOLN
INTRAMUSCULAR | Status: AC
  Filled 2019-09-24: qty 2

## 2019-09-24 MED ORDER — BUPIVACAINE HCL (PF) 0.25 % IJ SOLN
INTRAMUSCULAR | Status: DC | PRN
  Administered 2019-09-24: 14 mL

## 2019-09-24 MED ORDER — KETOROLAC TROMETHAMINE 30 MG/ML IJ SOLN
INTRAMUSCULAR | Status: DC | PRN
  Administered 2019-09-24: 30 mg via INTRAVENOUS

## 2019-09-24 MED ORDER — SCOPOLAMINE 1 MG/3DAYS TD PT72
MEDICATED_PATCH | TRANSDERMAL | Status: AC
  Filled 2019-09-24: qty 1

## 2019-09-24 MED ORDER — OXYCODONE HCL 5 MG PO TABS: 5.0000 mg | ORAL_TABLET | ORAL | Status: DC | PRN

## 2019-09-24 MED ORDER — ONDANSETRON HCL 4 MG/2ML IJ SOLN
INTRAMUSCULAR | Status: DC | PRN
  Administered 2019-09-24: 4 mg via INTRAVENOUS

## 2019-09-24 MED ORDER — FENTANYL CITRATE (PF) 250 MCG/5ML IJ SOLN
INTRAMUSCULAR | Status: AC
  Filled 2019-09-24: qty 5

## 2019-09-24 MED ORDER — MIDAZOLAM HCL 2 MG/2ML IJ SOLN
INTRAMUSCULAR | Status: AC
  Filled 2019-09-24: qty 2

## 2019-09-24 MED ORDER — VANCOMYCIN HCL 1500 MG/300ML IV SOLN
1500.0000 mg | Freq: Two times a day (BID) | INTRAVENOUS | Status: DC
  Administered 2019-09-24 – 2019-09-25 (×2): 1500 mg via INTRAVENOUS
  Filled 2019-09-24 (×2): qty 300

## 2019-09-24 MED ORDER — SCOPOLAMINE 1 MG/3DAYS TD PT72
MEDICATED_PATCH | TRANSDERMAL | Status: DC | PRN
  Administered 2019-09-24: 1 via TRANSDERMAL

## 2019-09-24 MED ORDER — PROPOFOL 10 MG/ML IV BOLUS
INTRAVENOUS | Status: AC
  Filled 2019-09-24: qty 40

## 2019-09-24 MED ORDER — MIDAZOLAM HCL 5 MG/5ML IJ SOLN
INTRAMUSCULAR | Status: DC | PRN
  Administered 2019-09-24: 2 mg via INTRAVENOUS

## 2019-09-24 MED ORDER — ONDANSETRON HCL 4 MG/2ML IJ SOLN: 4.0000 mg | Freq: Four times a day (QID) | INTRAMUSCULAR | Status: DC | PRN

## 2019-09-24 MED ORDER — LACTATED RINGERS IV SOLN: INTRAVENOUS | Status: DC | PRN

## 2019-09-24 MED ORDER — SODIUM CHLORIDE 0.9 % IV SOLN
2.0000 g | INTRAVENOUS | Status: DC
  Administered 2019-09-24: 2 g via INTRAVENOUS
  Filled 2019-09-24: qty 2

## 2019-09-24 MED ORDER — PROPOFOL 500 MG/50ML IV EMUL
INTRAVENOUS | Status: DC | PRN
  Administered 2019-09-24: 180 ug/kg/min via INTRAVENOUS

## 2019-09-24 MED ORDER — PROPOFOL 10 MG/ML IV BOLUS
INTRAVENOUS | Status: AC
  Filled 2019-09-24: qty 20

## 2019-09-24 MED ORDER — SODIUM CHLORIDE 0.9 % IR SOLN
Status: DC | PRN
  Administered 2019-09-24: 3000 mL

## 2019-09-24 MED ORDER — ACETAMINOPHEN 10 MG/ML IV SOLN
INTRAVENOUS | Status: DC | PRN
  Administered 2019-09-24: 1000 mg via INTRAVENOUS

## 2019-09-24 SURGICAL SUPPLY — 45 items
BAG ZIPLOCK 12X15 (MISCELLANEOUS) ×2 IMPLANT
BANDAGE ESMARK 6X9 LF (GAUZE/BANDAGES/DRESSINGS) ×1 IMPLANT
BNDG ELASTIC 6X5.8 VLCR STR LF (GAUZE/BANDAGES/DRESSINGS) ×2 IMPLANT
BNDG ESMARK 6X9 LF (GAUZE/BANDAGES/DRESSINGS) ×2
COVER SURGICAL LIGHT HANDLE (MISCELLANEOUS) ×2 IMPLANT
COVER WAND RF STERILE (DRAPES) IMPLANT
CUFF TOURN SGL QUICK 34 (TOURNIQUET CUFF) ×1
CUFF TRNQT CYL 34X4.125X (TOURNIQUET CUFF) ×1 IMPLANT
DERMABOND ADVANCED (GAUZE/BANDAGES/DRESSINGS) ×1
DERMABOND ADVANCED .7 DNX12 (GAUZE/BANDAGES/DRESSINGS) ×1 IMPLANT
DRAPE SHEET LG 3/4 BI-LAMINATE (DRAPES) ×2 IMPLANT
DRSG ADAPTIC 3X8 NADH LF (GAUZE/BANDAGES/DRESSINGS) ×2 IMPLANT
DRSG AQUACEL AG ADV 3.5X10 (GAUZE/BANDAGES/DRESSINGS) ×2 IMPLANT
DRSG PAD ABDOMINAL 8X10 ST (GAUZE/BANDAGES/DRESSINGS) ×2 IMPLANT
DRSG TEGADERM 4X4.75 (GAUZE/BANDAGES/DRESSINGS) ×2 IMPLANT
DURAPREP 26ML APPLICATOR (WOUND CARE) ×2 IMPLANT
ELECT REM PT RETURN 15FT ADLT (MISCELLANEOUS) ×2 IMPLANT
EVACUATOR 1/8 PVC DRAIN (DRAIN) ×2 IMPLANT
GAUZE SPONGE 2X2 8PLY STRL LF (GAUZE/BANDAGES/DRESSINGS) ×1 IMPLANT
GAUZE SPONGE 4X4 12PLY STRL (GAUZE/BANDAGES/DRESSINGS) ×2 IMPLANT
GAUZE XEROFORM 1X8 LF (GAUZE/BANDAGES/DRESSINGS) ×2 IMPLANT
GLOVE BIO SURGEON STRL SZ7.5 (GLOVE) ×2 IMPLANT
GLOVE BIOGEL PI IND STRL 8 (GLOVE) ×2 IMPLANT
GLOVE BIOGEL PI INDICATOR 8 (GLOVE) ×2
GLOVE ECLIPSE 8.0 STRL XLNG CF (GLOVE) IMPLANT
GOWN SPEC L3 XXLG W/TWL (GOWN DISPOSABLE) ×4 IMPLANT
GOWN STRL REUS W/TWL LRG LVL3 (GOWN DISPOSABLE) ×2 IMPLANT
HANDPIECE INTERPULSE COAX TIP (DISPOSABLE) ×1
KIT BASIN OR (CUSTOM PROCEDURE TRAY) ×2 IMPLANT
KIT TURNOVER KIT A (KITS) IMPLANT
MANIFOLD NEPTUNE II (INSTRUMENTS) ×2 IMPLANT
PACK TOTAL JOINT (CUSTOM PROCEDURE TRAY) ×2 IMPLANT
PADDING CAST COTTON 6X4 STRL (CAST SUPPLIES) ×2 IMPLANT
PENCIL SMOKE EVACUATOR (MISCELLANEOUS) IMPLANT
PROTECTOR NERVE ULNAR (MISCELLANEOUS) ×2 IMPLANT
SET HNDPC FAN SPRY TIP SCT (DISPOSABLE) ×1 IMPLANT
SPONGE GAUZE 2X2 STER 10/PKG (GAUZE/BANDAGES/DRESSINGS) ×1
STAPLER VISISTAT 35W (STAPLE) ×2 IMPLANT
SUT MNCRL AB 4-0 PS2 18 (SUTURE) ×2 IMPLANT
SUT VIC AB 1 CT1 36 (SUTURE) ×4 IMPLANT
SUT VIC AB 2-0 CT1 27 (SUTURE) ×3
SUT VIC AB 2-0 CT1 TAPERPNT 27 (SUTURE) ×3 IMPLANT
SWAB COLLECTION DEVICE MRSA (MISCELLANEOUS) ×2 IMPLANT
SWAB CULTURE ESWAB REG 1ML (MISCELLANEOUS) ×2 IMPLANT
TOWEL OR 17X26 10 PK STRL BLUE (TOWEL DISPOSABLE) ×4 IMPLANT

## 2019-09-24 NOTE — Op Note (Signed)
NAMEWILBORN, MEMBRENO MEDICAL RECORD [id number] ACCOUNT [id number] DATE OF BIRTH:[birth year] FACILITY: [facility] LOCATION: [facility] PHYSICIAN:Jaimee Corum Aretha Parrot, MD  OPERATIVE REPORT  DATE OF PROCEDURE:  09/24/2019  PREOPERATIVE DIAGNOSIS:  Left knee prepatellar bursa infection with associated cellulitis.  POSTOPERATIVE DIAGNOSIS:  Left knee prepatellar bursa infection with associated cellulitis.  PROCEDURE:  Incision and drainage with debridement of left knee prepatellar bursa with sharp excisional debridement of necrotic bursal tissue.  SURGEON:  Vanita Panda. Magnus Ivan, MD  ANESTHESIA: 1.  General. 2.  Local with 0.25% plain Marcaine.  TOURNIQUET TIME:  10 minutes.  COMPLICATIONS:  None.  FINDINGS:  Abundant fluid and minimal gross purulence around the prepatellar bursa of the left knee.  INDICATIONS:  The patient is a 34 year old gentleman who developed prepatellar bursitis of the left knee around Christmas time.  He was seen in the emergency room and was felt to have a local prepatellar bursa infection.  He was given a dose of IV  antibiotics and sent home with oral antibiotics.  He then presented to [facility] yesterday with worsening knee pain and redness and swelling.  He had had some fever and chills.  He was then transferred to [facility], admitted to the medicine  service for IV antibiotics.  He is presenting for now and irrigation and debridement of his prepatellar bursa infection.  I had a long and thorough discussion with him about the need to proceed with surgery given the failure of IV and oral antibiotics  and he does wish to proceed.  I had a long and thorough discussion about the risks and benefits of surgery.  DESCRIPTION OF PROCEDURE:  After informed consent was obtained and appropriate left knee was marked, he was brought to the operating room and placed supine on the operating table.  General anesthesia was then obtained.  A nonsterile tourniquet  was placed  around the upper left thigh and his left thigh, knee, leg, and ankle were prepped and draped with DuraPrep and sterile drapes and a sterile stockinette was utilized as well.  A timeout was called, and he was identified as correct patient and correct  left knee.  I then had the tourniquet inflated to 250 mm of pressure.  I then made a direct midline incision over the prepatellar bursa area and opened this up, and then an abundant amount of fluid came out from this.  I did send off some cultures.  I  then sharply excised the bursa sac in this area with a scalpel and used a rongeur to remove other necrotic fascia tissue.  This did not communicate with the joint.  I then irrigated 3 L of normal saline solution through the prepatellar bursa area using  pulsatile lavage.  I then loosely reapproximated the skin with interrupted 2-0 nylon suture.  A Xeroform well-padded sterile dressing was applied.  The tourniquet was let down.  His toes pinkened nicely.  He was awakened, extubated, and taken to recovery  room in stable condition.  All final counts were correct.  There were no complications noted.  Postoperatively, he will need at least 1 day of IV antibiotics before being discharged on oral antibiotics, which would likely need to be tomorrow morning.  LN/NUANCE  D:09/24/2019 T:09/24/2019 JOB:[id number]

## 2019-09-24 NOTE — Progress Notes (Signed)
Patient ID: Kyle Black, male   DOB: [birth year], 34 y.o.   MRN: [id number] Our plan is to proceed to surgery this morning for an irrigation and debridement of his left knee pre-patella bursal infection.  There has been no acute changes in his medical status.  Vitals are stable.  Risks and benefits of surgery have been discussed.

## 2019-09-24 NOTE — Brief Op Note (Signed)
09/24/2019  8:22 AM  PATIENT:  Kyle Black  34 y.o. male  PRE-OPERATIVE DIAGNOSIS:  pre-patella bursa infection  POST-OPERATIVE DIAGNOSIS:  pre-patella bursa infection  PROCEDURE:  Procedure(s): IRRIGATION AND DEBRIDEMENT KNEE (Left)  SURGEON:  Surgeon(s) and Role:    Kathryne Hitch, MD - Primary  ANESTHESIA:     COUNTS:  YES  TOURNIQUET:  * Missing tourniquet times found for documented tourniquets in log: [id number] * Total Tourniquet Time Documented: Thigh (Left) - 12 minutes Total: Thigh (Left) - 12 minutes   DICTATION: .Other Dictation: Dictation Number [id number]  PLAN OF CARE: Admit to inpatient   PATIENT DISPOSITION:  PACU - hemodynamically stable.   Delay start of Pharmacological VTE agent (>24hrs) due to surgical blood loss or risk of bleeding: no

## 2019-09-24 NOTE — Transfer of Care (Signed)
Immediate Anesthesia Transfer of Care Note  Patient: Kyle Black  Procedure(s) Performed: IRRIGATION AND DEBRIDEMENT KNEE (Left Knee)  Patient Location: PACU  Anesthesia Type:General  Level of Consciousness: awake, alert , oriented and patient cooperative  Airway & Oxygen Therapy: Patient Spontanous Breathing and Patient connected to face mask oxygen  Post-op Assessment: Report given to RN, Post -op Vital signs reviewed and stable and Patient moving all extremities X 4  Post vital signs: stable  Last Vitals:  Vitals Value Taken Time  BP 116/71 09/24/19 0830  Temp    Pulse 59 09/24/19 0838  Resp 13 09/24/19 0838  SpO2 92 % 09/24/19 0838  Vitals shown include unvalidated device data.  Last Pain:  Vitals:   09/24/19 0827  TempSrc:   PainSc: (P) 3       Patients Stated Pain Goal: 2 (09/23/19 2251)  Complications: No apparent anesthesia complications

## 2019-09-24 NOTE — Anesthesia Postprocedure Evaluation (Signed)
Anesthesia Post Note  Patient: Kyle Black  Procedure(s) Performed: IRRIGATION AND DEBRIDEMENT KNEE (Left Knee)     Patient location during evaluation: PACU Anesthesia Type: General Level of consciousness: sedated Pain management: pain level controlled Vital Signs Assessment: post-procedure vital signs reviewed and stable Respiratory status: spontaneous breathing and respiratory function stable Cardiovascular status: stable Postop Assessment: no apparent nausea or vomiting Anesthetic complications: no    Last Vitals:  Vitals:   09/24/19 0827 09/24/19 0828  BP: 115/73   Pulse: (!) 58   Resp: 14 16  Temp: (!) 36.4 C   SpO2: 100%     Last Pain:  Vitals:   09/24/19 0830  TempSrc:   PainSc: 4                  Brydan Downard DANIEL

## 2019-09-24 NOTE — Progress Notes (Signed)
Pharmacy Antibiotic Note  Kyle Black is a 34 y.o. male admitted on 09/23/2019 with worsening knee infection.  Patient was seen in late December and discharged on oral antibiotic.  Pharmacy has been consulted for vancomycin dosing for septic bursitis. 09/24/2019 D#2 Vanc/Rocephin S/p I&D L knee prepatellar bursa infxn: minimal purulence AF, WBC WNL, SCr WNL  Plan: Change to Vanc 1500 IV Q12H for AUC 533 using SCr 0.87 and Vd 0.5 Increase Rocephin to 2 gm IV q24 Monitor renal fxn, clinical progress, culture data Vancomycin levels as needed  Height: 6\' 1"  (185.4 cm) Weight: 214 lb 12.8 oz (97.4 kg) IBW/kg (Calculated) : 79.9  Temp (24hrs), Avg:97.8 F (36.6 C), Min:97.5 F (36.4 C), Max:98.7 F (37.1 C)  Recent Labs  Lab 09/23/19 1217 09/24/19 0405  WBC 8.2 8.7  CREATININE 0.83 0.87    Estimated Creatinine Clearance: 145.7 mL/min (by C-G formula based on SCr of 0.87 mg/dL).    No Known Allergies Antimicrobials this admission:  Vanc 1/1 >> CTX 1/1 >> Dose adjustments this admission:  1/2 CTX 1 gm >> 2 gm 1/2 Vanc 2 gm q12> 1500 q12 for AUC 533 using SCr0.87 and Vd 0.5 Microbiology results:  1/1 covid - 1/1 BCx: NGTD 1/2 surgical cx Knee: sent 12/26 BCx2: NGF  1/27, Pharm.D [phone] 09/24/2019 1:58 PM

## 2019-09-24 NOTE — Progress Notes (Addendum)
PROGRESS NOTE  Kyle Black AYT:[id number] DOB: 06-21-1986 DOA: 09/23/2019 PCP: [facility]  HPI/Recap of past 24 hours: Kyle Black is a 34 y.o. male with medical history significant for anxiety/depression, presents to the ED complaining of worsening pain, swelling, erythema on the anterior portion of his left knee for the past 1 week.  Patient was seen in the ER on 09/17/2019, of which there was concern for possible gout versus septic bursitis.  Patient was given IV vancomycin, and signed AMA after receiving just 1 dose.  Patient was given a prescription for Keflex and doxycycline of which patient reported that he filled and took it, without any significant improvement in symptoms. Patient currently denies any similar condition in the past, denies any fever/chills, diarrhea, abdominal pain, chest pain, shortness of breath, recent fall or insect bites.  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 Patient was seen at [facility] ED, vital signs stable, labs unremarkable.  Case was discussed with orthopedics Dr. Magnus Ivan, recommended admission for I&D, and IV antibiotics, due to failed outpatient therapy.  COVID-19 virus test pending.  Hospitalist asked to admit patient.     Today, saw patient status post I&D, reports tolerable pain, denies any new complaints, denies any chest pain, shortness of breath, abdominal pain, nausea/vomiting, fever/chills.  Eager to be discharged.    Assessment/Plan: Active Problems:   Prepatellar bursitis   Septic infrapatellar bursitis of left knee   Left knee prepatella bursitis with associated cellulitis s/p I&D on 09/24/19 Currently afebrile, with no leukocytosis BC X 2 NGTD Left knee x-ray done on 09/17/2019 showed mild prepatellar subcutaneous edema, with no evidence of fracture, dislocation, or joint effusion, no focal bone abnormality or evidence of arthropathy  Orthopedics on board s/p  I&D on 09/24/2019, pain and DVT management as per Ortho Surgical deep wound culture and Gram stain pending Continue IV Vanco, ceftriaxone, plan to change to PO and d/c probably on 09/25/19 Monitor closely  Depression/anxiety Continue home Celexa         Malnutrition Type:      Malnutrition Characteristics:      Nutrition Interventions:       Estimated body mass index is 28.34 kg/m as calculated from the following:   Height as of this encounter: 6\' 1"  (1.854 m).   Weight as of this encounter: 97.4 kg.     Code Status: Full  Family Communication: None at bedside  Disposition Plan: Likely home on 09/25/2019   Consultants:  Orthopedics, Dr. 11/23/2019  Procedures:  Status post I&D on 09/25/2019  Antimicrobials:  Vancomycin  Ceftriaxone  DVT prophylaxis: SCD   Objective: Vitals:   09/24/19 0933 09/24/19 1035 09/24/19 1203 09/24/19 1320  BP: 113/75 121/80 121/78 119/80  Pulse: (!) 52 62 67 60  Resp: 16 18 18    Temp: 97.6 F (36.4 C) 97.7 F (36.5 C) 97.8 F (36.6 C) 97.9 F (36.6 C)  TempSrc: Oral Oral Oral Oral  SpO2: 97% 98% 96% 96%  Weight:      Height:        Intake/Output Summary (Last 24 hours) at 09/24/2019 1719 Last data filed at 09/24/2019 1400 Gross per 24 hour  Intake 1720 ml  Output 5 ml  Net 1715 ml   Filed Weights   09/23/19 1055  Weight: 97.4 kg    Exam:  General: NAD   Cardiovascular: S1, S2 present  Respiratory: CTAB  Abdomen: Soft, nontender, nondistended, bowel sounds present  Musculoskeletal: No bilateral pedal edema noted, LLE in Ace wrap, C/D/  Skin: Normal  Psychiatry: Normal mood   Data Reviewed: CBC: Recent Labs  Lab 09/23/19 1217 09/24/19 0405  WBC 8.2 8.7  NEUTROABS 5.8  --   HGB 13.9 13.0  HCT 41.6 39.5  MCV 92.4 91.6  PLT 305 304   Basic Metabolic Panel: Recent Labs  Lab 09/23/19 1217 09/24/19 0405  NA 138 140  K 5.0 4.1  CL 102 105  CO2 27 25  GLUCOSE 96 93  BUN 15 20   CREATININE 0.83 0.87  CALCIUM 9.6 9.0   GFR: Estimated Creatinine Clearance: 145.7 mL/min (by C-G formula based on SCr of 0.87 mg/dL). Liver Function Tests: Recent Labs  Lab 09/23/19 1217 09/24/19 0405  AST 29 24  ALT 29 25  ALKPHOS 80 67  BILITOT 0.7 0.3  PROT 8.0 6.9  ALBUMIN 4.1 3.3*   No results for input(s): LIPASE, AMYLASE in the last 168 hours. No results for input(s): AMMONIA in the last 168 hours. Coagulation Profile: No results for input(s): INR, PROTIME in the last 168 hours. Cardiac Enzymes: No results for input(s): CKTOTAL, CKMB, CKMBINDEX, TROPONINI in the last 168 hours. BNP (last 3 results) No results for input(s): PROBNP in the last 8760 hours. HbA1C: No results for input(s): HGBA1C in the last 72 hours. CBG: No results for input(s): GLUCAP in the last 168 hours. Lipid Profile: No results for input(s): CHOL, HDL, LDLCALC, TRIG, CHOLHDL, LDLDIRECT in the last 72 hours. Thyroid Function Tests: No results for input(s): TSH, T4TOTAL, FREET4, T3FREE, THYROIDAB in the last 72 hours. Anemia Panel: No results for input(s): VITAMINB12, FOLATE, FERRITIN, TIBC, IRON, RETICCTPCT in the last 72 hours. Urine analysis: No results found for: COLORURINE, APPEARANCEUR, LABSPEC, PHURINE, GLUCOSEU, HGBUR, BILIRUBINUR, KETONESUR, PROTEINUR, UROBILINOGEN, NITRITE, LEUKOCYTESUR Sepsis Labs: @LABRCNTIP (procalcitonin:4,lacticidven:4)  ) Recent Results (from the past 240 hour(s))  Blood culture (routine x 2)     Status: None   Collection Time: 09/17/19  4:45 AM   Specimen: BLOOD RIGHT HAND  Result Value Ref Range Status   Specimen Description BLOOD RIGHT HAND  Final   Special Requests   Final    BOTTLES DRAWN AEROBIC AND ANAEROBIC Blood Culture adequate volume   Culture   Final    NO GROWTH 5 DAYS Performed at [facility], [address] [place], [place]    Report Status 09/22/2019 FINAL  Final  Blood culture (routine x 2)     Status: None   Collection  Time: 09/17/19  4:50 AM   Specimen: BLOOD LEFT HAND  Result Value Ref Range Status   Specimen Description BLOOD LEFT HAND  Final   Special Requests   Final    BOTTLES DRAWN AEROBIC AND ANAEROBIC Blood Culture adequate volume   Culture   Final    NO GROWTH 5 DAYS Performed at [facility], [address], [place], [place]    Report Status 09/22/2019 FINAL  Final  Blood culture (routine x 2)     Status: None (Preliminary result)   Collection Time: 09/23/19 12:15 PM   Specimen: BLOOD RIGHT ARM  Result Value Ref Range Status   Specimen Description   Final    BLOOD RIGHT ARM Performed at [facility], [address] [place], [place]    Special Requests   Final    BOTTLES DRAWN AEROBIC AND ANAEROBIC Blood Culture adequate volume Performed at [facility],  [place], [place] [address]    Culture   Final    NO GROWTH < 24 HOURS Performed at [facility] [address], [place], [place] [address]    Report Status PENDING  Incomplete  Blood culture (routine x 2)     Status: None (Preliminary result)   Collection Time: 09/23/19 12:30 PM   Specimen: BLOOD LEFT ARM  Result Value Ref Range Status   Specimen Description   Final    BLOOD LEFT ARM Performed at [facility], [address] [place], [place] [address]    Special Requests   Final    BOTTLES DRAWN AEROBIC AND ANAEROBIC Blood Culture adequate volume Performed at [facility], [address] [place], [place] [address]    Culture   Final    NO GROWTH < 24 HOURS Performed at [facility], [place] [address] [place], [place] [address]    Report Status PENDING  Incomplete  SARS CORONAVIRUS 2 (TAT 6-24 HRS) Nasopharyngeal Nasopharyngeal Swab     Status: None   Collection Time: 09/23/19  1:25 PM   Specimen: Nasopharyngeal Swab  Result Value Ref Range Status   SARS Coronavirus 2 NEGATIVE NEGATIVE Final    Comment: (NOTE) SARS-CoV-2  target nucleic acids are NOT DETECTED. The SARS-CoV-2 RNA is generally detectable in upper and lower respiratory specimens during the acute phase of infection. Negative results do not preclude SARS-CoV-2 infection, do not rule out co-infections with other pathogens, and should not be used as the sole basis for treatment or other patient management decisions. Negative results must be combined with clinical observations, patient history, and epidemiological information. The expected result is Negative. Fact Sheet for Patients: SugarRoll.be Fact Sheet for Healthcare Providers: https://www.woods-mathews.com/ This test is not yet approved or cleared by the [place] FDA and  has been authorized for detection and/or diagnosis of SARS-CoV-2 by FDA under an Emergency Use Authorization (EUA). This EUA will remain  in effect (meaning this test can be used) for the duration of the COVID-19 declaration under Section 56 4(b)(1) of the Act, 21 U.S.C. section 360bbb-3(b)(1), unless the authorization is terminated or revoked sooner. Performed at [facility], [place] [address] [place], [place] [address]   Aerobic/Anaerobic Culture (surgical/deep wound)     Status: None (Preliminary result)   Collection Time: 09/24/19  8:08 AM   Specimen: PATH Other; Tissue  Result Value Ref Range Status   Specimen Description   Final    WOUND KNEE Performed at [facility], [place] [address], [place], [place] [address]    Special Requests   Final    NONE Performed at [facility], [place] [address] [place],  [address]    Gram Stain PENDING  Incomplete   Culture PENDING  Incomplete   Report Status PENDING  Incomplete      Studies: No results found.  Scheduled Meds: . citalopram  40 mg Oral Daily  . scopolamine      . sodium chloride flush  3 mL Intravenous Q12H    Continuous Infusions: . sodium chloride Stopped (09/23/19  1402)  . acetaminophen    . cefTRIAXone (ROCEPHIN)  IV    . vancomycin       LOS: 1 day     Alma Friendly, MD [facility]  If 7PM-7AM, please contact night-coverage [email or web address] 09/24/2019, 5:19 PM

## 2019-09-24 NOTE — Anesthesia Procedure Notes (Signed)
Procedure Name: LMA Insertion Date/Time: 09/24/2019 7:57 AM Performed by: Illene Silver, CRNA Pre-anesthesia Checklist: Patient identified, Emergency Drugs available, Patient being monitored, Suction available and Timeout performed Patient Re-evaluated:Patient Re-evaluated prior to induction Oxygen Delivery Method: Circle system utilized Preoxygenation: Pre-oxygenation with 100% oxygen Induction Type: IV induction Ventilation: Mask ventilation without difficulty LMA: LMA with gastric port inserted LMA Size: 5.0 Tube type: Oral Number of attempts: 1 Placement Confirmation: positive ETCO2 Tube secured with: Tape Dental Injury: Teeth and Oropharynx as per pre-operative assessment

## 2019-09-25 LAB — CBC
HCT: 39.4 % (ref 39.0–52.0)
Hemoglobin: 13.2 g/dL (ref 13.0–17.0)
MCH: 30.8 pg (ref 26.0–34.0)
MCHC: 33.5 g/dL (ref 30.0–36.0)
MCV: 92.1 fL (ref 80.0–100.0)
Platelets: 324 10*3/uL (ref 150–400)
RBC: 4.28 MIL/uL (ref 4.22–5.81)
RDW: 11.8 % (ref 11.5–15.5)
WBC: 12 10*3/uL — ABNORMAL HIGH (ref 4.0–10.5)
nRBC: 0 % (ref 0.0–0.2)

## 2019-09-25 MED ORDER — DOXYCYCLINE HYCLATE 100 MG PO CAPS: 100.0000 mg | ORAL_CAPSULE | Freq: Two times a day (BID) | ORAL | 0 refills | Status: DC

## 2019-09-25 MED ORDER — HYDROCODONE-ACETAMINOPHEN 5-325 MG PO TABS: 1.0000 | ORAL_TABLET | Freq: Four times a day (QID) | ORAL | 0 refills | Status: DC | PRN

## 2019-09-25 NOTE — Discharge Summary (Signed)
Patient ID: Kyle Black MRN: [id number] DOB/AGE: April 04, 1986 34 y.o.  Admit date: 09/23/2019 Discharge date: 09/25/2019  Admission Diagnoses:  Active Problems:   Prepatellar bursitis   Septic infrapatellar bursitis of left knee   Discharge Diagnoses:  Same  Past Medical History:  Diagnosis Date  . Anxiety and depression     Surgeries: Procedure(s): IRRIGATION AND DEBRIDEMENT KNEE on 09/24/2019   Consultants: Treatment Team:  Mcarthur Rossetti, MD  Discharged Condition: Improved  Hospital Course: Deangleo Passage is an 34 y.o. male who was admitted 09/23/2019 for operative treatment of<principal problem not specified>. Patient has severe unremitting pain that affects sleep, daily activities, and work/hobbies. After pre-op clearance the patient was taken to the operating room on 09/24/2019 and underwent  Procedure(s): IRRIGATION AND DEBRIDEMENT KNEE.    Patient was given perioperative antibiotics:  Anti-infectives (From admission, onward)   Start     Dose/Rate Route Frequency Ordered Stop   09/25/19 0000  doxycycline (VIBRAMYCIN) 100 MG capsule     100 mg Oral 2 times daily 09/25/19 0726     09/24/19 2200  cefTRIAXone (ROCEPHIN) 2 g in sodium chloride 0.9 % 100 mL IVPB     2 g 200 mL/hr over 30 Minutes Intravenous Every 24 hours 09/24/19 1352     09/24/19 1800  vancomycin (VANCOREADY) IVPB 1500 mg/300 mL     1,500 mg 150 mL/hr over 120 Minutes Intravenous Every 12 hours 09/24/19 1355     09/24/19 1300  cefTRIAXone (ROCEPHIN) 1 g in sodium chloride 0.9 % 100 mL IVPB  Status:  Discontinued     1 g 200 mL/hr over 30 Minutes Intravenous Every 24 hours 09/23/19 1726 09/24/19 1352   09/23/19 1730  cefTRIAXone (ROCEPHIN) 1 g in sodium chloride 0.9 % 100 mL IVPB  Status:  Discontinued     1 g 200 mL/hr over 30 Minutes Intravenous  Once 09/23/19 1726 09/23/19 1726   09/23/19 1400  vancomycin (VANCOREADY) IVPB 2000 mg/400 mL  Status:  Discontinued     2,000 mg 200 mL/hr over 120  Minutes Intravenous Every 12 hours 09/23/19 1326 09/24/19 1355   09/23/19 1352  vancomycin (VANCOCIN) 1-5 GM/200ML-% IVPB    Note to Pharmacy: Marinus Maw   : cabinet override      09/23/19 1352 09/23/19 1406   09/23/19 1315  cefTRIAXone (ROCEPHIN) 1 g in sodium chloride 0.9 % 100 mL IVPB     1 g 200 mL/hr over 30 Minutes Intravenous  Once 09/23/19 1302 09/23/19 1354       Postoperatively the patient looks much better the day following surgery.  The redness has dissipated from his left knee and his pain is minimal.  He can be discharged to home on oral antibiotics and close follow-up.  Patient benefited maximally from hospital stay and there were no complications.    Recent vital signs:  Patient Vitals for the past 24 hrs:  BP Temp Temp src Pulse Resp SpO2  09/25/19 0511 (!) 142/73 98 F (36.7 C) Oral (!) 55 16 97 %  09/24/19 2130 (!) 152/83 98 F (36.7 C) Oral 60 16 97 %  09/24/19 1725 133/77 97.9 F (36.6 C) Oral 87 -- 97 %  09/24/19 1320 119/80 97.9 F (36.6 C) Oral 60 -- 96 %  09/24/19 1203 121/78 97.8 F (36.6 C) Oral 67 18 96 %  09/24/19 1035 121/80 97.7 F (36.5 C) Oral 62 18 98 %  09/24/19 0933 113/75 97.6 F (36.4 C) Oral (!) 52 16 97 %  09/24/19 0915 -- -- -- (!) 55 -- 96 %  09/24/19 0911 115/73 (!) 97.5 F (36.4 C) -- -- 16 95 %  09/24/19 0900 112/72 -- -- (!) 50 15 94 %  09/24/19 0845 129/82 -- -- 63 20 97 %  09/24/19 0830 116/71 -- -- (!) 57 13 99 %  09/24/19 0828 -- -- -- -- 16 --  09/24/19 0827 115/73 (!) 97.5 F (36.4 C) -- (!) 58 14 100 %     Recent laboratory studies:  Recent Labs    09/23/19 1217 09/24/19 0405 09/25/19 0514  WBC 8.2 8.7 12.0*  HGB 13.9 13.0 13.2  HCT 41.6 39.5 39.4  PLT 305 304 324  NA 138 140  --   K 5.0 4.1  --   CL 102 105  --   CO2 27 25  --   BUN 15 20  --   CREATININE 0.83 0.87  --   GLUCOSE 96 93  --   CALCIUM 9.6 9.0  --      Discharge Medications:   Allergies as of 09/25/2019   No Known Allergies      Medication List    STOP taking these medications   acetaminophen 650 MG CR tablet Commonly known as: Tylenol 8 Hour   cephALEXin 500 MG capsule Commonly known as: KEFLEX   ibuprofen 800 MG tablet Commonly known as: ADVIL     TAKE these medications   citalopram 40 MG tablet Commonly known as: CELEXA Take 40 mg by mouth daily.   doxycycline 100 MG capsule Commonly known as: VIBRAMYCIN Take 1 capsule (100 mg total) by mouth 2 (two) times daily.   HYDROcodone-acetaminophen 5-325 MG tablet Commonly known as: Norco Take 1-2 tablets by mouth every 6 (six) hours as needed for moderate pain.   naproxen 375 MG tablet Commonly known as: NAPROSYN Take 1 tablet (375 mg total) by mouth 2 (two) times daily.       Diagnostic Studies: DG Knee Complete 4 Views Left  Result Date: 09/17/2019 CLINICAL DATA:  Pain and swelling with redness EXAM: LEFT KNEE - COMPLETE 4+ VIEW COMPARISON:  None. FINDINGS: No evidence of fracture, dislocation, or joint effusion. There appears to be slight cortical thickening of the posterior distal femoral metadiaphysis which could be from prior cortical desmoid or stress reaction. No evidence of arthropathy or other focal bone abnormality. Mild prepatellar subcutaneous edema is noted. IMPRESSION: No acute osseous abnormality. Electronically Signed   By: Jonna Clark M.D.   On: 09/17/2019 01:45    Disposition: Discharge disposition: 01-Home or Self Care         Follow-up Information    Kathryne Hitch, MD. Schedule an appointment as soon as possible for a visit in 2 week(s).   Specialty: Orthopedic Surgery Contact information: [address] [place] [address] [phone]            Signed: Kathryne Hitch 09/25/2019, 7:26 AM

## 2019-09-25 NOTE — Discharge Instructions (Signed)
You can get your current left knee dressing wet in the shower daily. You have been given a few dressings to change as needed. Do elevate your left foot at different times throughout the day due to swelling. Do not perform any high impact aerobic activities or lower extremity exercises for the next 2 weeks.

## 2019-09-25 NOTE — Progress Notes (Signed)
Patient ID: Kyle Black, male   DOB: [birth year], 34 y.o.   MRN: [id number] The patient overall looks better clinically today.  He reports minimal pain.  I changed his left knee dressing at the bedside.  There is no redness.  The incision looks good.  I placed a new dressing.  He does have some dependent edema in his left foot.  I am comfortable with discharging him to home today with close follow-up in the office.  I will send him out on doxycycline and hydrocodone.  I have talked to him about wound care and about elevating his foot for swelling.  He will hold out from any high impact aerobic activities until further notice.  All question concerns were answered and addressed.

## 2019-09-25 NOTE — Progress Notes (Signed)
D/C instructions given to patient. Patient had no questions. NT or writer will wheel patient out once his ride is here  

## 2019-09-28 LAB — CULTURE, BLOOD (ROUTINE X 2)
Culture: NO GROWTH
Culture: NO GROWTH
Special Requests: ADEQUATE
Special Requests: ADEQUATE

## 2019-09-29 LAB — AEROBIC/ANAEROBIC CULTURE W GRAM STAIN (SURGICAL/DEEP WOUND)

## 2019-09-30 ENCOUNTER — Ambulatory Visit: Payer: No Typology Code available for payment source | Admitting: Family Medicine

## 2019-10-05 ENCOUNTER — Ambulatory Visit (INDEPENDENT_AMBULATORY_CARE_PROVIDER_SITE_OTHER): Payer: No Typology Code available for payment source | Admitting: Orthopaedic Surgery

## 2019-10-05 ENCOUNTER — Encounter: Payer: Self-pay | Admitting: Orthopaedic Surgery

## 2019-10-05 ENCOUNTER — Other Ambulatory Visit: Payer: Self-pay

## 2019-10-05 DIAGNOSIS — M71062 Abscess of bursa, left knee: Secondary | ICD-10-CM

## 2019-10-05 NOTE — Progress Notes (Signed)
Kyle Black is being seen 2 weeks status post irrigation debridement of an infected bursa of the left knee.  This was a prepatellar infrapatellar bursa.  The culture did grow out a sensitive staph.  He was on doxycycline and is now doing well overall.  He did do a leg workout today.  He is very athletic individual.  On examination of his left knee the cellulitis is resolved.  The sutures have already been removed and his incision looks good.  He is got excellent knee function.  He knows to watch his knee closely.  If anything worsens at all he will let us know.  After he has been on the antibiotics for 2 weeks he can stop them.  All question concerns were answered and addressed.

## 2019-10-07 ENCOUNTER — Telehealth: Payer: Self-pay | Admitting: Orthopaedic Surgery

## 2019-10-07 NOTE — Telephone Encounter (Signed)
10/05/19 ov note faxed to [facility] [phone], ph [phone]

## 2021-03-29 ENCOUNTER — Encounter (HOSPITAL_COMMUNITY): Payer: Self-pay | Admitting: Emergency Medicine

## 2021-03-29 ENCOUNTER — Other Ambulatory Visit (HOSPITAL_COMMUNITY): Payer: Self-pay

## 2021-03-29 ENCOUNTER — Emergency Department (HOSPITAL_COMMUNITY)
Admission: EM | Admit: 2021-03-29 | Discharge: 2021-03-29 | Disposition: A | Discharge status: Home / Self Care | Payer: No Typology Code available for payment source | Attending: Emergency Medicine | Admitting: Emergency Medicine

## 2021-03-29 DIAGNOSIS — M7918 Myalgia, other site: Secondary | ICD-10-CM

## 2021-03-29 DIAGNOSIS — R253 Fasciculation: Secondary | ICD-10-CM | POA: Insufficient documentation

## 2021-03-29 DIAGNOSIS — M546 Pain in thoracic spine: Secondary | ICD-10-CM | POA: Insufficient documentation

## 2021-03-29 DIAGNOSIS — Z87891 Personal history of nicotine dependence: Secondary | ICD-10-CM | POA: Insufficient documentation

## 2021-03-29 DIAGNOSIS — M79621 Pain in right upper arm: Secondary | ICD-10-CM | POA: Diagnosis not present

## 2021-03-29 DIAGNOSIS — M25562 Pain in left knee: Secondary | ICD-10-CM | POA: Diagnosis present

## 2021-03-29 DIAGNOSIS — M25511 Pain in right shoulder: Secondary | ICD-10-CM | POA: Insufficient documentation

## 2021-03-29 DIAGNOSIS — M62838 Other muscle spasm: Secondary | ICD-10-CM | POA: Insufficient documentation

## 2021-03-29 MED ORDER — METHOCARBAMOL 500 MG PO TABS
500.0000 mg | ORAL_TABLET | Freq: Two times a day (BID) | ORAL | 0 refills | Status: DC
  Filled 2021-03-29: qty 20, 10d supply, fill #0

## 2021-03-29 MED ORDER — DICLOFENAC SODIUM 1 % EX GEL
2.0000 g | Freq: Four times a day (QID) | CUTANEOUS | 0 refills | Status: DC
  Filled 2021-03-29: qty 100, 12d supply, fill #0

## 2021-03-29 NOTE — Discharge Instructions (Addendum)
Use voltaren as directed   You were given a prescription for Robaxin which is a muscle relaxer.  You should not drive, work, or operate machinery while taking this medication as it can make you very drowsy.    Please follow up with your primary care provider or with the orthopedic doctor within 5-7 days for re-evaluation of your symptoms. If you do not have a primary care provider, information for a healthcare clinic has been provided for you to make arrangements for follow up care. Please return to the emergency department for any new or worsening symptoms.

## 2021-03-29 NOTE — ED Provider Notes (Signed)
[facility]-EMERGENCY DEPT Provider Note   CSN: [id number] Arrival date & time: 03/29/21  1456     History Chief Complaint  Patient presents with   Shoulder Pain   Knee Pain    Carvel Huskins is a 35 y.o. male.  HPI  35 year old male presenting the emergency department today for evaluation of pain to the right shoulder and right upper extremity as well as left knee pain.  He states that he was playing with his daughter in a bouncy house about a month ago when he injured his right shoulder.  He describes pain starting at the right upper back that radiates down the posterior aspect of the right upper extremity which is associated with intermittent muscle spasms and twitching.  He has not had any numbness.  States he was prescribed meloxicam but this gave him GERD so he stopped taking it.  He is further complaining of pain to the left knee that has been present for least the last 1 to 2 months.  He has had this evaluated at the [place] and states that he has already had x-rays but his symptoms persist.  Past Medical History:  Diagnosis Date   Anxiety and depression     Patient Active Problem List   Diagnosis Date Noted   Prepatellar bursitis 09/23/2019   Septic infrapatellar bursitis of left knee     Past Surgical History:  Procedure Laterality Date   HAND SURGERY Right 03/2004   right hand fracture   IRRIGATION AND DEBRIDEMENT KNEE Left 09/24/2019   Procedure: IRRIGATION AND DEBRIDEMENT KNEE;  Surgeon: Kathryne Hitch, MD;  Location: [facility];  Service: Orthopedics;  Laterality: Left;       Family History  Problem Relation Age of Onset   Healthy Mother    Healthy Father     Social History   Tobacco Use   Smoking status: Former    Pack years: 0.00   Smokeless tobacco: Current    Types: Chew  Substance Use Topics   Alcohol use: Yes    Comment: occassionally   Drug use: No    Home Medications Prior to Admission medications   Medication Sig  Start Date End Date Taking? Authorizing Provider  diclofenac Sodium (VOLTAREN) 1 % GEL Apply 2 g topically 4 (four) times daily. 03/29/21  Yes Loyce Klasen S, PA-C  methocarbamol (ROBAXIN) 500 MG tablet Take 1 tablet (500 mg total) by mouth 2 (two) times daily. 03/29/21  Yes Noe Pittsley S, PA-C  citalopram (CELEXA) 40 MG tablet Take 40 mg by mouth daily.    [provider]  doxycycline (VIBRAMYCIN) 100 MG capsule Take 1 capsule (100 mg total) by mouth 2 (two) times daily. 09/25/19   Kathryne Hitch, MD  HYDROcodone-acetaminophen (NORCO) 5-325 MG tablet Take 1-2 tablets by mouth every 6 (six) hours as needed for moderate pain. 09/25/19   Kathryne Hitch, MD    Allergies    Patient has no known allergies.  Review of Systems   Review of Systems  Constitutional:  Negative for fever.  Musculoskeletal:        Left knee pain, rue pain  Neurological:  Negative for weakness and numbness.   Physical Exam Updated Vital Signs BP 130/82   Pulse (!) 56   Temp 98.3 F (36.8 C) (Oral)   Resp 16   SpO2 98%   Physical Exam Constitutional:      General: He is not in acute distress.    Appearance: He is  well-developed.  Eyes:     Conjunctiva/sclera: Conjunctivae normal.  Cardiovascular:     Rate and Rhythm: Normal rate.  Pulmonary:     Effort: Pulmonary effort is normal.  Musculoskeletal:     Comments: TTP with muscle spasm along the right trapezius, rhomboids, deltoid, and teres minor. No point ttp to the right shoulder joint. From of the rue active and passively. Strength/sensation of the bue/ble is intact. Mild ttp to the superolateral and medial left knee. No erythema, swelling, or effusion. From of the left knee.  Skin:    General: Skin is warm and dry.  Neurological:     Mental Status: He is alert and oriented to person, place, and time.    ED Results / Procedures / Treatments   Labs (all labs ordered are listed, but only abnormal results are displayed) Labs  Reviewed - No data to display  EKG None  Radiology No results found.  Procedures Procedures   Medications Ordered in ED Medications - No data to display  ED Course  I have reviewed the triage vital signs and the nursing notes.  Pertinent labs & imaging results that were available during my care of the patient were reviewed by me and considered in my medical decision making (see chart for details).    MDM Rules/Calculators/A&P                          35 year old male presenting the emergency department today for evaluation of pain to the right upper extremity as well as left knee.  Right upper extremity pain seems to be consistent with muscle spasms, he has no neurologic deficits and has full range of motion of the joint.  Left knee pain is also chronic and this has been worked up previously by the [place].  There is no evidence of septic arthritis on exam.  Have recommended anti-inflammatories and muscle relaxers for symptoms and will refer to Ortho.  Have advised on return precautions.  He voiced understanding the plan and reasons to return.  All questions answered.  Patient stable for discharge.  Final Clinical Impression(s) / ED Diagnoses Final diagnoses:  Musculoskeletal pain    Rx / DC Orders ED Discharge Orders          Ordered    diclofenac Sodium (VOLTAREN) 1 % GEL  4 times daily        03/29/21 1625    methocarbamol (ROBAXIN) 500 MG tablet  2 times daily        03/29/21 [address] [place], PA-C 03/29/21 1627    Bethann Berkshire, MD 03/31/21 1702

## 2021-03-29 NOTE — ED Triage Notes (Signed)
Pt c/o right shoulder pain and left knee pain from injury 2 months ago. Pt was seen at [facility] and was rx'd pain meds, but states meds didn't provide relief. Reports muscle spasms down right arm.Ambulatory in triage.

## 2021-04-10 ENCOUNTER — Other Ambulatory Visit (HOSPITAL_COMMUNITY): Payer: Self-pay | Admitting: Orthopedic Surgery

## 2021-04-10 ENCOUNTER — Other Ambulatory Visit: Payer: Self-pay | Admitting: Orthopedic Surgery

## 2021-04-10 DIAGNOSIS — Z1389 Encounter for screening for other disorder: Secondary | ICD-10-CM

## 2021-04-10 DIAGNOSIS — M25562 Pain in left knee: Secondary | ICD-10-CM

## 2021-04-10 DIAGNOSIS — M25511 Pain in right shoulder: Secondary | ICD-10-CM

## 2021-04-16 ENCOUNTER — Other Ambulatory Visit: Payer: Self-pay

## 2021-04-16 ENCOUNTER — Ambulatory Visit (HOSPITAL_COMMUNITY)
Admission: RE | Admit: 2021-04-16 | Discharge: 2021-04-16 | Disposition: A | Discharge status: Home / Self Care | Payer: No Typology Code available for payment source | Source: Ambulatory Visit | Attending: Orthopedic Surgery | Admitting: Orthopedic Surgery

## 2021-04-16 DIAGNOSIS — M25511 Pain in right shoulder: Secondary | ICD-10-CM | POA: Insufficient documentation

## 2021-04-16 DIAGNOSIS — M25562 Pain in left knee: Secondary | ICD-10-CM | POA: Insufficient documentation

## 2021-04-16 IMAGING — MR MR SHOULDER*R* W/O CM
5 series · 40 of 40 positions shown · non-contrast
Comparison: None.

CLINICAL DATA: Right shoulder pain after injury 2 months ago

EXAM:
MRI OF THE RIGHT SHOULDER WITHOUT CONTRAST
TECHNIQUE: Multiplanar, multisequence MR imaging of the shoulder was performed.
No intravenous contrast was administered.

[Series 6: T2 fat-sat · axial · right · 4.0mm · 0.62mm/px · z∈[-76,+38]mm · 8 of 27 slices shown (1 of 3)]
[im 1/27]
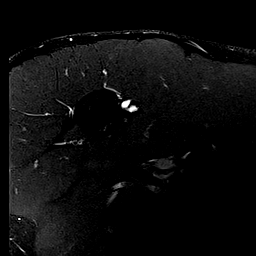
[im 4/27]
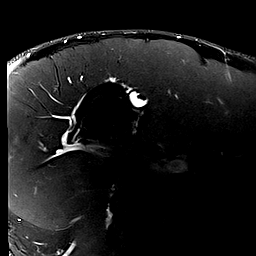
[im 8/27]
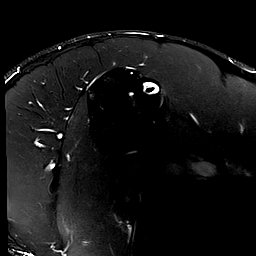
[im 12/27]
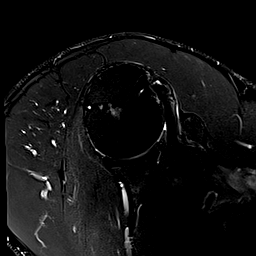
[im 15/27]
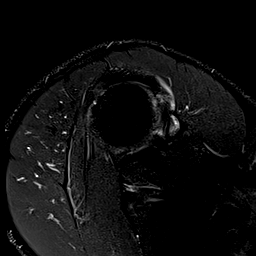
[im 19/27]
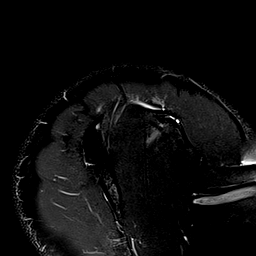
[im 23/27]
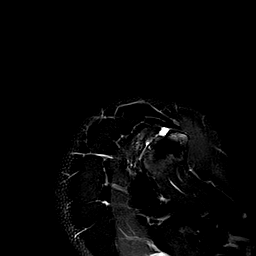
[im 27/27]
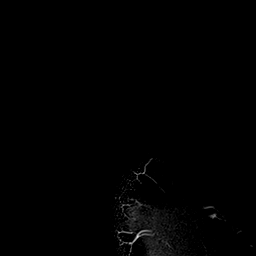

[Series 7: T2 fat-sat · oblique · right · 4.0mm · 0.50mm/px · 7 of 20 slices shown (2 of 3)]
[im 1/20]
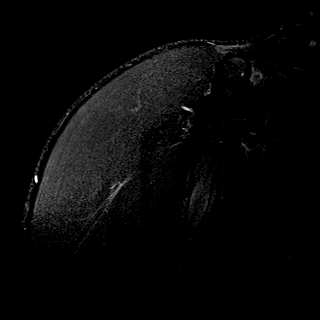
[im 4/20]
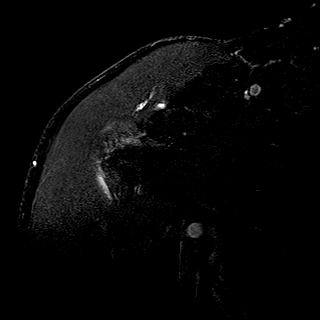
[im 7/20]
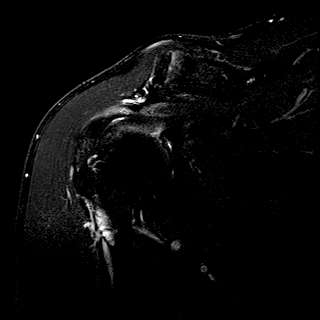
[im 10/20]
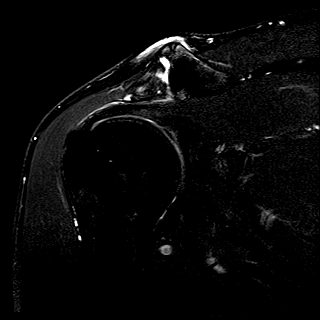
[im 13/20]
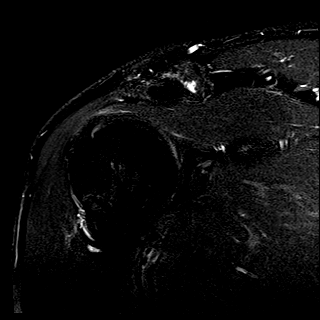
[im 16/20]
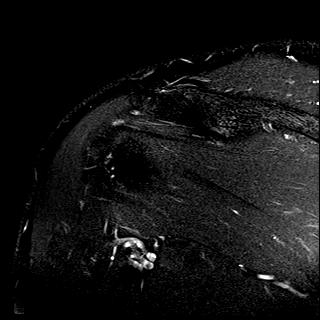
[im 20/20]
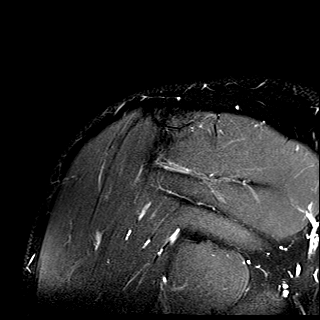

[Series 8: PD · oblique · right · 4.0mm · 0.50mm/px · 7 of 20 slices shown]
[im 1/20]
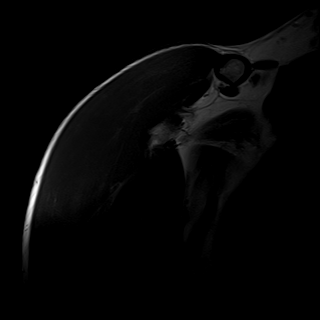
[im 4/20]
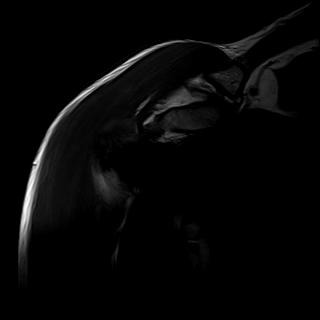
[im 7/20]
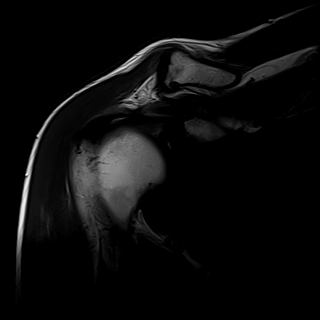
[im 10/20]
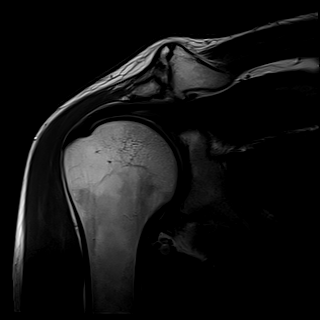
[im 13/20]
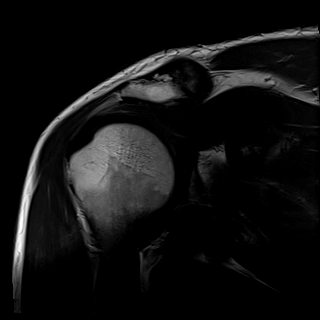
[im 16/20]
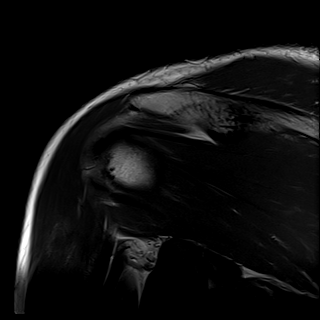
[im 20/20]
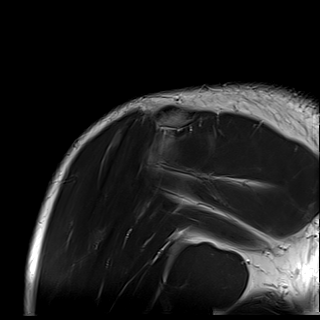

[Series 9: T1 · oblique · right · 3.0mm · 0.59mm/px · 9 of 26 slices shown]
[im 1/26]
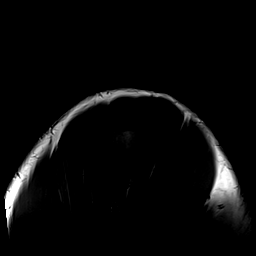
[im 4/26]
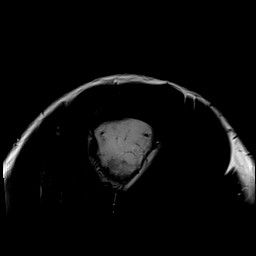
[im 7/26]
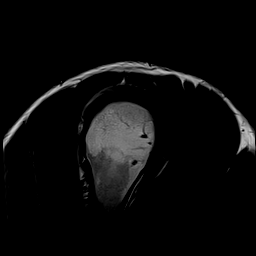
[im 10/26]
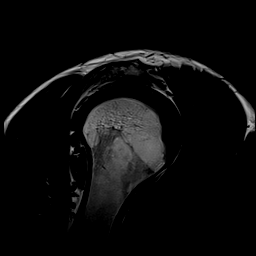
[im 13/26]
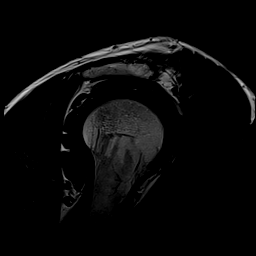
[im 16/26]
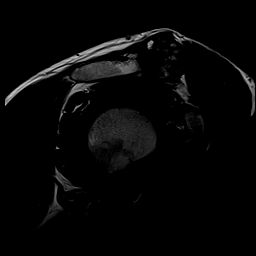
[im 19/26]
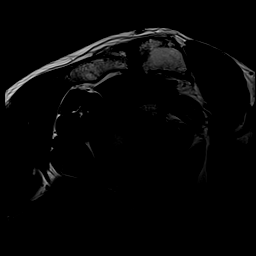
[im 22/26]
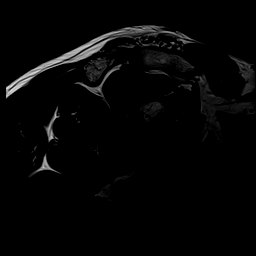
[im 26/26]
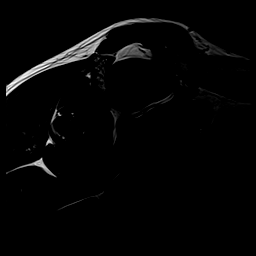

[Series 10: T2 fat-sat · oblique · right · 3.0mm · 0.59mm/px · 9 of 26 slices shown (3 of 3)]
[im 1/26]
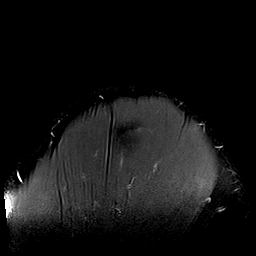
[im 4/26]
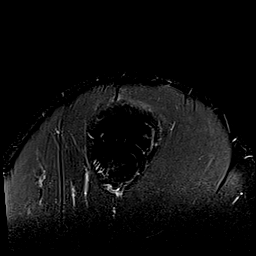
[im 7/26]
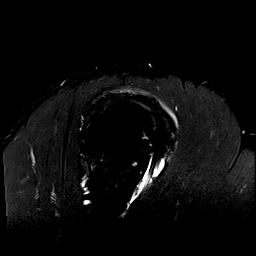
[im 10/26]
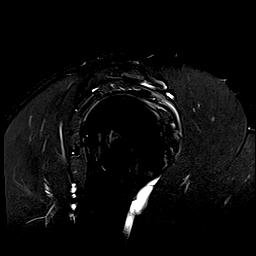
[im 13/26]
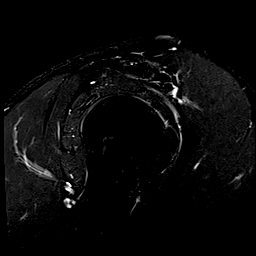
[im 16/26]
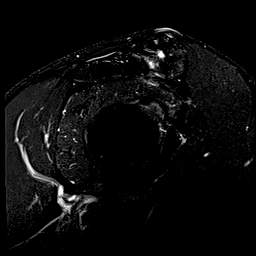
[im 19/26]
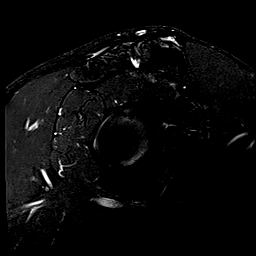
[im 22/26]
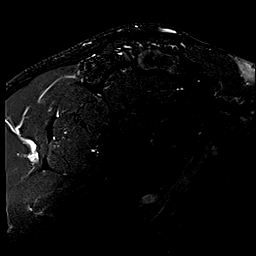
[im 26/26]
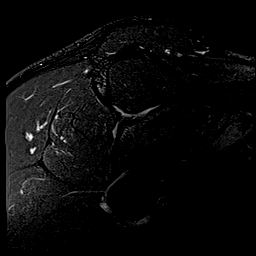

[40 of 40 positions shown; findings below may reference images not displayed]

FINDINGS: Rotator cuff: Moderate subscapularis tendinosis with low-grade
interstitial tearing of the distal tendon. Mild supraspinatus
tendinosis with low-grade bursal sided fraying/irregularity
anteriorly in the region of the critical zone. Infraspinatus and
teres minor tendons are within normal limits. No full-thickness
rotator cuff tear.

Muscles: Preserved bulk and signal intensity of the rotator cuff
musculature without edema, atrophy, or fatty infiltration.

Biceps long head: Moderate intra-articular biceps tendinosis. Mild
tenosynovitis.

Acromioclavicular Joint: Severe arthropathy of the acromioclavicular
joint with prominent distal clavicular osteophytosis. No significant
subacromial-subdeltoid bursal fluid.

Glenohumeral Joint: No joint effusion. No chondral defect.

Labrum: Labral evaluation is limited in the absence of
intra-articular fluid or contrast. Intermediate signal within the
posterosuperior labrum may reflect labral degeneration and/or
tearing. No paralabral cyst.

Bones: No acute fracture or dislocation. No suspicious bone lesion.

Other: None.
IMPRESSION: 1. Moderate subscapularis tendinosis with low-grade interstitial
tearing of the distal tendon.
2. Mild supraspinatus tendinosis with low-grade bursal sided
fraying/irregularity anteriorly in the region of the critical zone.
3. Moderate intra-articular biceps tendinosis with mild
tenosynovitis.
4. Severe acromioclavicular arthropathy with prominent distal
clavicular osteophytosis.

## 2021-04-16 IMAGING — MR MR KNEE*L* W/O CM
6 series · 39 of 40 positions shown · non-contrast
Comparison: X-ray [DATE]

CLINICAL DATA: Left knee pain from injury 2 months ago

EXAM:
MRI OF THE LEFT KNEE WITHOUT CONTRAST
TECHNIQUE: Multiplanar, multisequence MR imaging of the knee was performed. No
intravenous contrast was administered.

[Series 5: T2 fat-sat · axial · left · 4.0mm · 0.50mm/px · z∈[-95,+58]mm · 8 of 36 slices shown (1 of 3)]
[im 1/36]
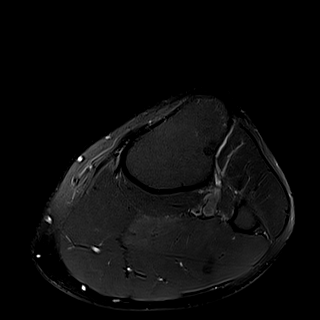
[im 6/36]
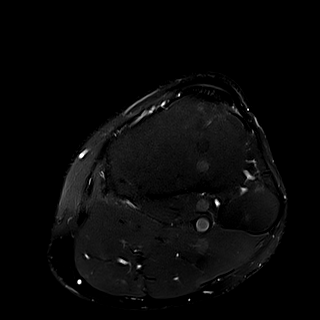
[im 11/36]
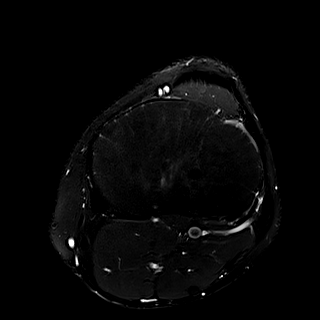
[im 16/36]
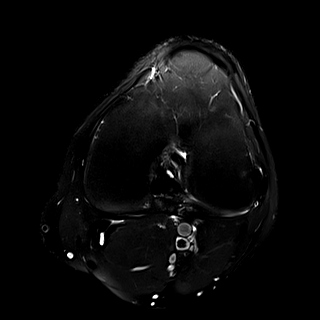
[im 21/36]
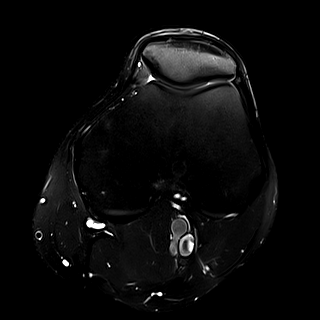
[im 26/36]
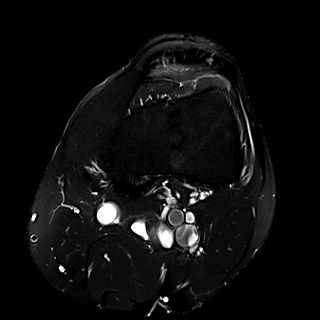
[im 31/36]
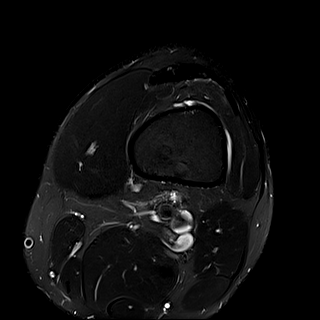
[im 36/36]
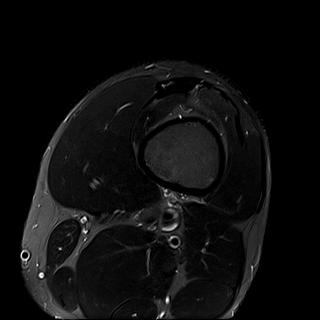

[Series 6: T1 · coronal · left · 4.0mm · 0.29mm/px · 5 of 25 slices shown]
[im 1/25]
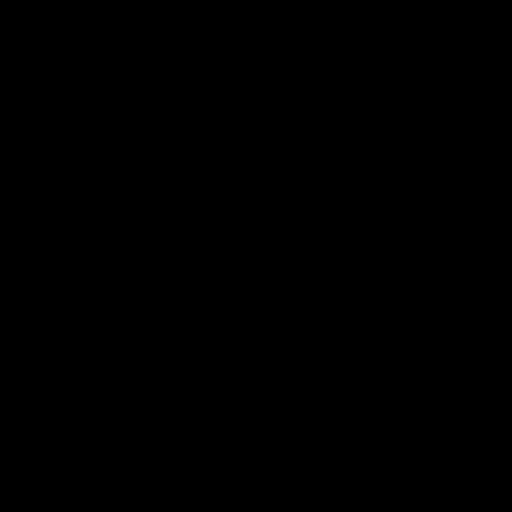
[im 5/25]
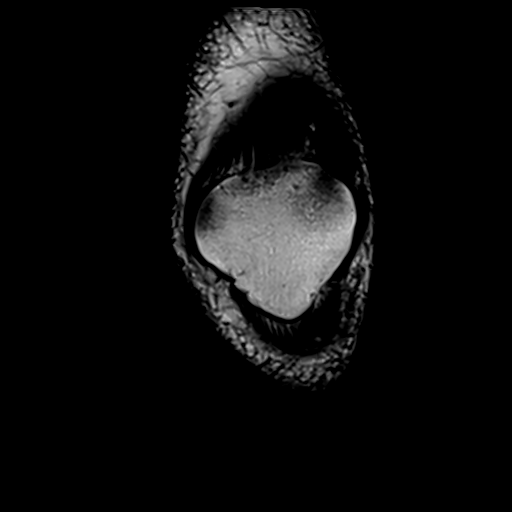
[im 10/25]
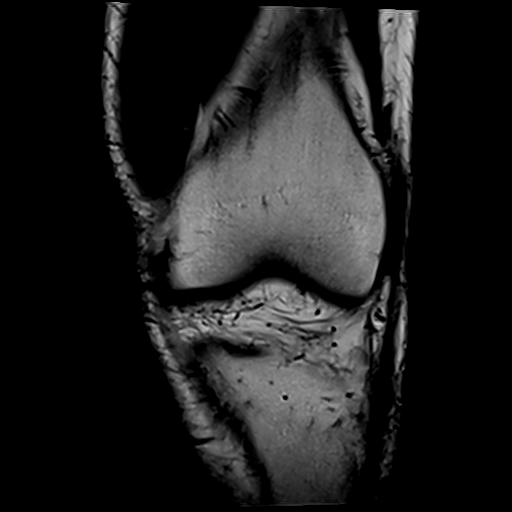
[im 15/25]
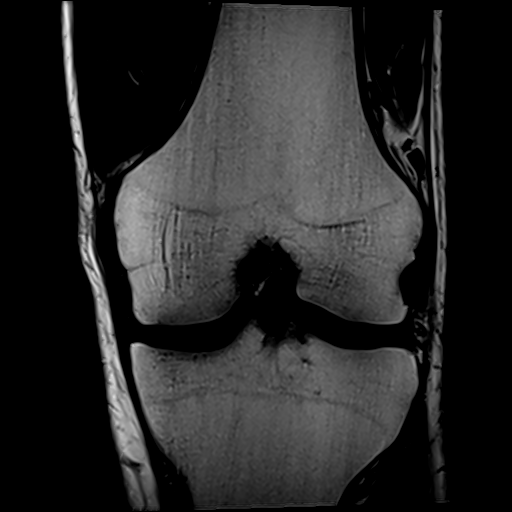
[im 20/25]
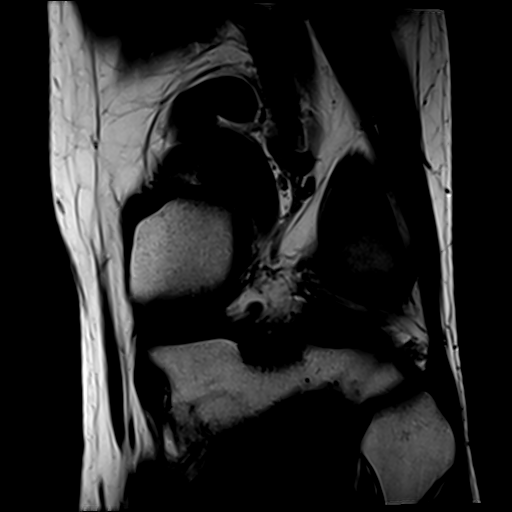

[Series 7: T2 fat-sat · coronal · left · 4.0mm · 0.59mm/px · 6 of 25 slices shown (2 of 3)]
[im 1/25]
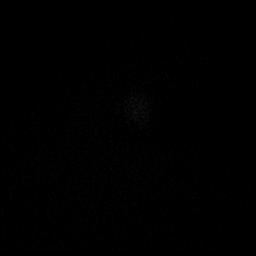
[im 5/25]
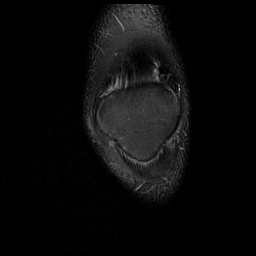
[im 10/25]
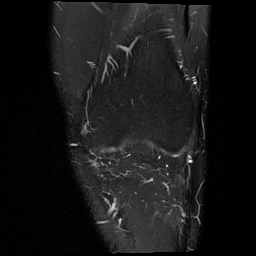
[im 15/25]
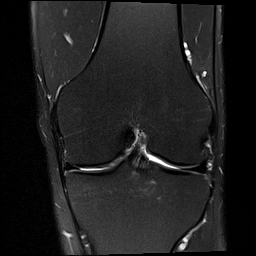
[im 20/25]
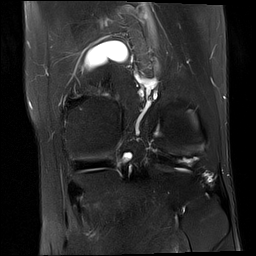
[im 25/25]
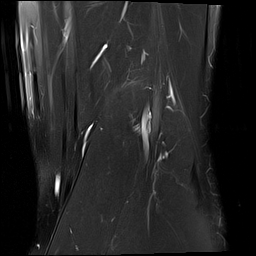

[Series 8: T2 fat-sat · sagittal · left · 4.0mm · 0.47mm/px · 6 of 24 slices shown (3 of 3)]
[im 1/24]
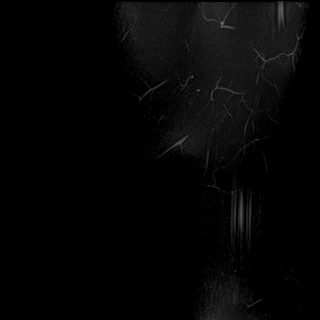
[im 5/24]
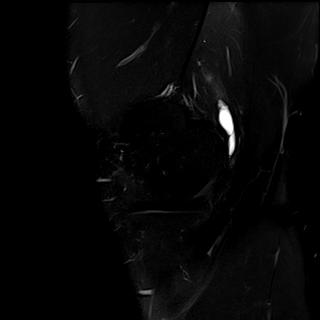
[im 10/24]
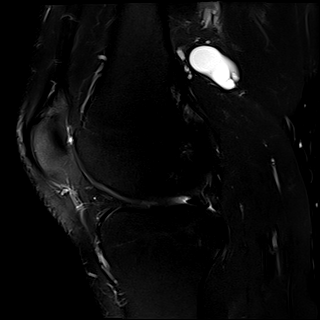
[im 14/24]
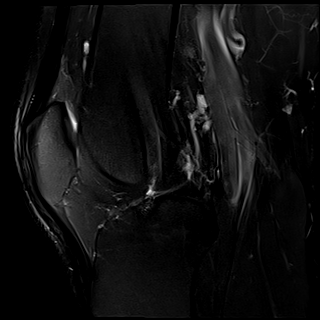
[im 19/24]
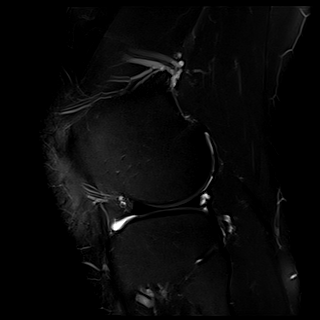
[im 24/24]
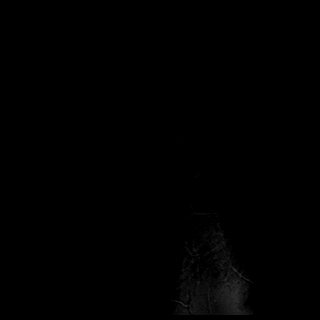

[Series 9: PD fat-sat · coronal · left · 3.0mm · 0.47mm/px · 8 of 32 slices shown (1 of 2)]
[im 1/32]
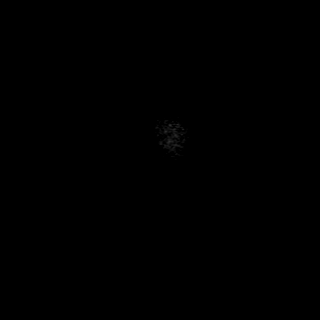
[im 5/32]
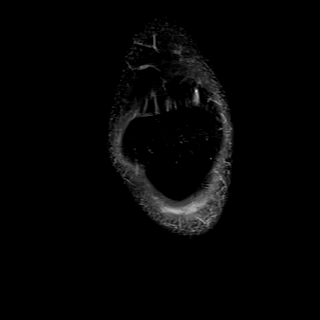
[im 9/32]
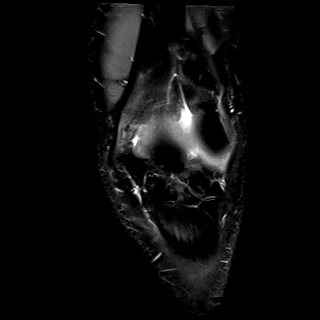
[im 14/32]
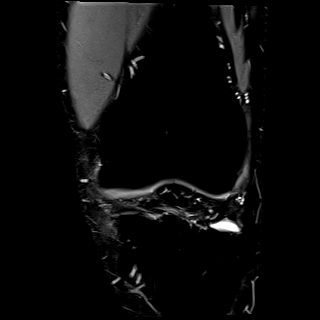
[im 18/32]
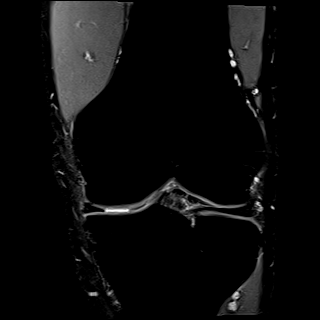
[im 23/32]
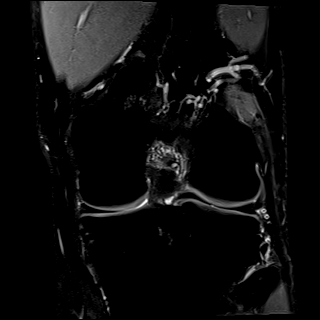
[im 27/32]
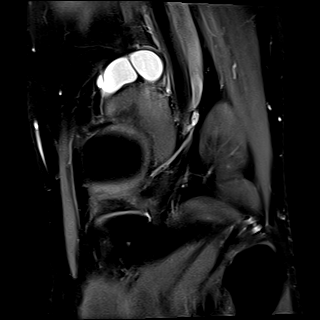
[im 32/32]
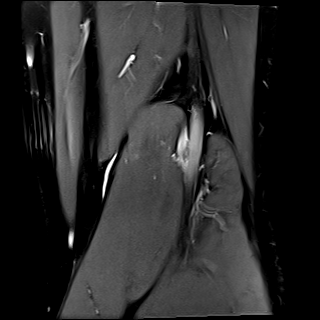

[Series 10: PD fat-sat · sagittal · left · 4.0mm · 0.47mm/px · 6 of 25 slices shown (2 of 2)]
[im 1/25]
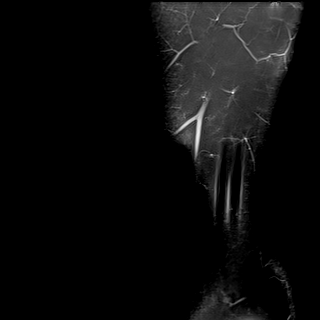
[im 5/25]
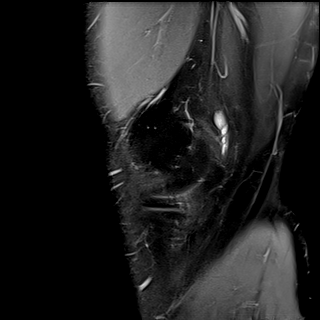
[im 10/25]
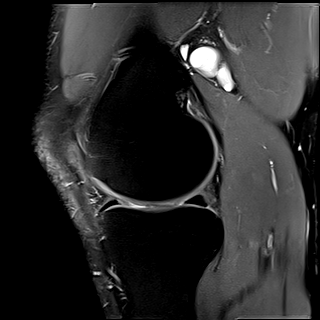
[im 15/25]
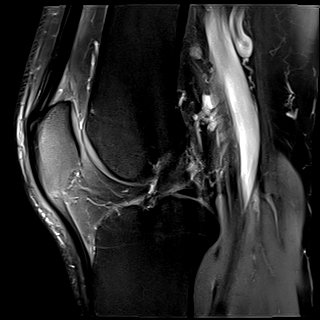
[im 20/25]
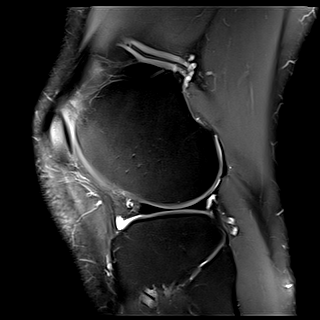
[im 25/25]
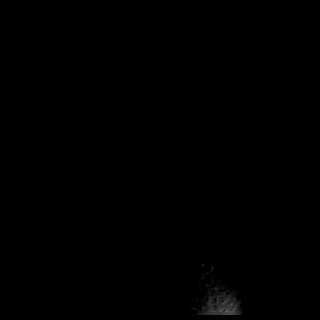

[39 of 40 positions shown; findings below may reference images not displayed]

FINDINGS: MENISCI

Medial meniscus:  Mild intrasubstance degeneration without tear.

Lateral meniscus:  Intact.

LIGAMENTS

Cruciates:  Intact ACL and PCL.

Collaterals: Medial collateral ligament is intact. Lateral
collateral ligament complex is intact.

CARTILAGE

Patellofemoral:  No chondral defect.

Medial: Mild chondral thinning and surface irregularity of the
weight-bearing medial compartment. No focal chondral defect.

Lateral:  No chondral defect.

Joint:  No joint effusion.  Normal fat pads.

Popliteal Fossa: Lobulated cystic structure at the posterior aspect
of the knee measuring approximately 6.4 x 2.1 cm which appears to be
contiguous with a trace Baker's cyst. Intact popliteus tendon.

Extensor Mechanism: Intact quadriceps tendon and patellar tendon.
Moderate patellar tendinosis.

Bones: No focal marrow signal abnormality. No fracture or
dislocation.

Other: None.
IMPRESSION: 1. No evidence of internal derangement of the left knee.
2. Moderate patellar tendinosis.
3. Lobulated cystic structure at the posterior aspect of the knee
measuring 6.4 x 2.1 cm which appears to be contiguous with a trace
Baker's cyst.
4. Early mild medial compartment osteoarthritis.

## 2022-01-03 ENCOUNTER — Other Ambulatory Visit (HOSPITAL_COMMUNITY): Payer: Self-pay

## 2022-05-06 ENCOUNTER — Other Ambulatory Visit (HOSPITAL_COMMUNITY): Payer: Self-pay | Admitting: Physician Assistant

## 2022-05-06 ENCOUNTER — Other Ambulatory Visit: Payer: Self-pay | Admitting: Physician Assistant

## 2022-05-06 DIAGNOSIS — M549 Dorsalgia, unspecified: Secondary | ICD-10-CM

## 2022-05-21 ENCOUNTER — Ambulatory Visit (HOSPITAL_COMMUNITY)
Admission: RE | Admit: 2022-05-21 | Discharge: 2022-05-21 | Disposition: A | Discharge status: Home / Self Care | Payer: No Typology Code available for payment source | Source: Ambulatory Visit | Attending: Physician Assistant | Admitting: Physician Assistant

## 2022-05-21 DIAGNOSIS — M549 Dorsalgia, unspecified: Secondary | ICD-10-CM | POA: Diagnosis present

## 2022-06-11 ENCOUNTER — Other Ambulatory Visit: Payer: Self-pay | Admitting: Physician Assistant

## 2022-06-11 ENCOUNTER — Other Ambulatory Visit (HOSPITAL_COMMUNITY): Payer: Self-pay | Admitting: Physician Assistant

## 2022-06-11 DIAGNOSIS — M791 Myalgia, unspecified site: Secondary | ICD-10-CM

## 2022-06-18 ENCOUNTER — Ambulatory Visit (HOSPITAL_COMMUNITY)
Admission: RE | Admit: 2022-06-18 | Discharge: 2022-06-18 | Disposition: A | Discharge status: Home / Self Care | Payer: No Typology Code available for payment source | Source: Ambulatory Visit | Attending: Physician Assistant | Admitting: Physician Assistant

## 2022-06-18 DIAGNOSIS — M791 Myalgia, unspecified site: Secondary | ICD-10-CM | POA: Diagnosis not present

## 2022-06-18 MED ORDER — GADOPICLENOL 0.5 MMOL/ML IV SOLN
10.0000 mL | Freq: Once | INTRAVENOUS | Status: AC | PRN
  Administered 2022-06-18: 10 mL via INTRAVENOUS

## 2022-07-30 ENCOUNTER — Other Ambulatory Visit (HOSPITAL_COMMUNITY): Payer: Self-pay | Admitting: Orthopedic Surgery

## 2022-07-30 DIAGNOSIS — M542 Cervicalgia: Secondary | ICD-10-CM

## 2022-08-09 ENCOUNTER — Ambulatory Visit (HOSPITAL_COMMUNITY)
Admission: RE | Admit: 2022-08-09 | Discharge: 2022-08-09 | Disposition: A | Discharge status: Home / Self Care | Payer: No Typology Code available for payment source | Source: Ambulatory Visit | Attending: Orthopedic Surgery | Admitting: Orthopedic Surgery

## 2022-08-09 DIAGNOSIS — M542 Cervicalgia: Secondary | ICD-10-CM

## 2023-08-19 ENCOUNTER — Encounter: Payer: Self-pay | Admitting: Endocrinology

## 2023-08-19 ENCOUNTER — Ambulatory Visit (INDEPENDENT_AMBULATORY_CARE_PROVIDER_SITE_OTHER): Payer: No Typology Code available for payment source | Admitting: Endocrinology

## 2023-08-19 VITALS — BP 132/70 | HR 56 | Resp 20 | Ht 73.0 in | Wt 199.4 lb

## 2023-08-19 DIAGNOSIS — R7989 Other specified abnormal findings of blood chemistry: Secondary | ICD-10-CM

## 2023-08-19 NOTE — Progress Notes (Addendum)
Outpatient Endocrinology Note [name], MD  08/19/23  Patient's Name: Kyle Black    DOB: [birth year]    MRN: [id number]  REASON OF VISIT: New consult for low testosterone  REFERRING PROVIDER: Concha Pyo, MD  [facility]  [facility]  HISTORY OF PRESENT ILLNESS:   Kyle Black is a 37 y.o. old male with past medical history listed below, is here for new consult for low testosterone.  Pertinent Hx: Patient has history of depression and used to take citalopram, following with primary care provider however he taper off citalopram few months ago.  After being off of citalopram, he has noticed increased fatigue, low libido and erectile dysfunction.  Denies any breast tissue or nipple discharge. -He was evaluated by primary care provider and had lab with total testosterone level was 239 on July 01, 2023 at 7:37 AM which was checked at [facility], and referred to endocrinology for further evaluation and management.  In October 2024 TSH was normal 1.74.  He has no thyroid disorder.  Referral medical record from [place] clinic reviewed and scanned into media.  Libido:                          Decreased  Erectile Dysfunction: Yes Gynecomastia:           No Fathered any child:     Yes, daughter 7 years in 2024. May plan for children in the future.  Shaving less often:     No Opioid use:                  No Decreased strength    yes Decreased Muscle      No Tiredness/Fatigue       Yes Anabolic steroids:        No Weight lifting:              No Excessive exercise:    No Fragility fractures:                    No Vision issues               No Sense of Smell           Intact Truama, Radiation, Mumps  No  Interval history Patient presented for evaluation of low testosterone.   REVIEW OF SYSTEMS:  As per history of present illness.   PAST MEDICAL HISTORY: Past Medical History:  Diagnosis Date   Anxiety and depression     PAST SURGICAL HISTORY: Past  Surgical History:  Procedure Laterality Date   HAND SURGERY Right 03/2004   right hand fracture   IRRIGATION AND DEBRIDEMENT KNEE Left 09/24/2019   Procedure: IRRIGATION AND DEBRIDEMENT KNEE;  Surgeon: Kathryne Hitch, MD;  Location: [facility];  Service: Orthopedics;  Laterality: Left;    ALLERGIES: No Known Allergies  FAMILY HISTORY:  Family History  Problem Relation Age of Onset   Healthy Mother    Healthy Father     SOCIAL HISTORY: Social History   Socioeconomic History   Marital status: Single    Spouse name: Not on file   Number of children: Not on file   Years of education: Not on file   Highest education level: Not on file  Occupational History   Not on file  Tobacco Use   Smoking status: Former   Smokeless tobacco: Current    Types: Chew  Substance and Sexual Activity   Alcohol use:  Yes    Comment: occassionally   Drug use: No   Sexual activity: Not on file  Other Topics Concern   Not on file  Social History Narrative   Not on file   Social Determinants of Health   Financial Resource Strain: Not on file  Food Insecurity: Not on file  Transportation Needs: Not on file  Physical Activity: Not on file  Stress: Not on file  Social Connections: Not on file    MEDICATIONS:  Current Outpatient Medications  Medication Sig Dispense Refill   citalopram (CELEXA) 40 MG tablet Take 40 mg by mouth daily. (Patient not taking: Reported on 08/19/2023)     diclofenac Sodium (VOLTAREN) 1 % GEL Apply 2 g topically 4 (four) times daily. (Patient not taking: Reported on 08/19/2023) 100 g 0   doxycycline (VIBRAMYCIN) 100 MG capsule Take 1 capsule (100 mg total) by mouth 2 (two) times daily. (Patient not taking: Reported on 08/19/2023) 30 capsule 0   HYDROcodone-acetaminophen (NORCO) 5-325 MG tablet Take 1-2 tablets by mouth every 6 (six) hours as needed for moderate pain. (Patient not taking: Reported on 08/19/2023) 30 tablet 0   methocarbamol (ROBAXIN) 500 MG tablet  Take 1 tablet (500 mg total) by mouth 2 (two) times daily. (Patient not taking: Reported on 08/19/2023) 20 tablet 0   No current facility-administered medications for this visit.    PHYSICAL EXAM: Vitals:   08/19/23 1313 08/19/23 1315  BP: (!) 152/90 132/70  Pulse: (!) 56   Resp: 20   SpO2: 96%   Weight: 199 lb 6.4 oz (90.4 kg)   Height: 6\' 1"  (1.854 m)    Body mass index is 26.31 kg/m.  Wt Readings from Last 3 Encounters:  08/19/23 199 lb 6.4 oz (90.4 kg)  09/23/19 214 lb 12.8 oz (97.4 kg)  09/17/19 212 lb (96.2 kg)    General: Well developed, well nourished male in no apparent distress. Non-Cushingoid. No obvious Klinefelter's syndrome body habitus HEENT: AT/[place], no external lesions. Hearing intact to the spoken word Eyes: EOMI. No proptosis, stare and lid lag. Conjunctiva clear and no icterus. Visual field grossly intact in confrontation Neck: Trachea midline, neck supple without appreciable thyromegaly or lymphadenopathy and no palpable thyroid nodules Lungs: Clear to auscultation, no wheeze. Respirations not labored Heart: S1S2, Regular in rate and rhythm.  Abdomen: Soft, non tender, non distended Neurologic: Alert, oriented, normal speech, deep tendon biceps reflexes normal,  no gross focal neurological deficit. No obvious proximal weakness Extremities: No pedal pitting edema, no tremors of outstretched hands, no excessive hair growth Skin: Warm, color good. No sores or rashes noted Psychiatric: Does not appear depressed or anxious   PERTINENT HISTORIC LABORATORY AND IMAGING STUDIES:  All pertinent laboratory results were reviewed. Please see HPI also for further details.   ASSESSMENT / PLAN  1. Low testosterone in male     Mr. Kyle Black is a 37 y.o. old male with erectile dysfunction /low libido who was found to have low total testosterone levels in one occasion, was 239 in the morning.  Discussed that this one-time checking of total testosterone, we need further workup  and test for hypogonadism. -Discussed that SSRI/citalopram can also give symptoms of low libido.  And symptoms of fatigue or ED problem can be multifactorial. -Also discussed that testosterone therapy can suppress endogenous testosterone synthesis and potentially can have fertility issues if he plan for baby in the future.  Plan: -I would like to check total, bioavailable, free testosterone along with gonadotropin LH,  FSH -For secondary workup of hypogonadism I would like to check prolactin, iron studies, cortisol. -I like to check sex hormone binding globulin.  Low sex hormone binding globulin can makes total testosterone appropriately low. -Will consider MRI pituitary if the lab results indicating secondary hypogonadism. -If the lab results indicates hypogonadism will discuss more with the patient, if he wants to preserve his fertility will probably consider for hCG treatment.  If there is no plan for fertility then we will plan for testosterone therapy. -Rest of the plan based on the test results.  Patient will complete labs in the early morning fasting.  Diagnoses and all orders for this visit:  Low testosterone in male -     Sex hormone binding globulin -     Luteinizing hormone -     Prolactin -     Follicle stimulating hormone -     Cortisol -     Iron, TIBC and Ferritin Panel -     Testosterone Total,Free,Bio, Males    DISPOSITION Follow up in clinic in to be determined based on above lab results.  All questions answered and patient verbalized understanding of the plan.  [place] Kasir Hallenbeck, MD [facility] Endocrinology [facility] [address], [address] [place], [place] [address] Phone # [phone]  At least part of this note was generated using voice recognition software. Inadvertent word errors may have occurred, which were not recognized during the proofreading process.  Addendum: Testosterone level is normal 401 in the mid normal range, other parameters of  testosterone including free and bioavailable testosterone are also normal.  Testosterone secretion controlling hormones LH and FSH are normal as well.  Iron studies acceptable.  Prolactin normal.  Cortisol level is mildly elevated.  Unclear reason at this time.  I would like to recheck cortisol level in 1 to 2 weeks in the early morning and fasting.  I have placed order.    Latest Reference Range & Units 08/26/23 08:12  LH 1.5 - 9.3 mIU/mL 1.5  FSH 1.4 - 12.8 mIU/mL 2.4  Prolactin 2.0 - 18.0 ng/mL 4.9  Sex Horm Binding Glob, Serum 10 - 50 nmol/L 25  Testosterone 250 - 827 ng/dL 914  Testosterone, Bioavailable 110.0 - 575.0 ng/dL 782.9  Testosterone Free 46.0 - 224.0 pg/mL 65.4  Albumin MSPROF 3.6 - 5.1 g/dL 4.5    Latest Reference Range & Units 08/26/23 08:12  Cortisol, Plasma mcg/dL 56.2 (H)  (H): Data is abnormally high   Latest Reference Range & Units 08/26/23 08:12  Iron 50 - 180 mcg/dL 47 (L)  TIBC 130 - 865 mcg/dL (calc) 784  %SAT 20 - 48 % (calc) 14 (L)  Ferritin 38 - 380 ng/mL 200  (L): Data is abnormally low

## 2023-08-19 NOTE — Addendum Note (Signed)
Addended by: Ryun Velez, [place] on: 08/19/2023 03:50 PM   Modules accepted: Level of Service

## 2023-08-25 ENCOUNTER — Other Ambulatory Visit: Payer: Self-pay

## 2023-08-26 ENCOUNTER — Other Ambulatory Visit: Payer: No Typology Code available for payment source

## 2023-08-27 LAB — PROLACTIN: Prolactin: 4.9 ng/mL (ref 2.0–18.0)

## 2023-08-27 LAB — LUTEINIZING HORMONE: LH: 1.5 m[IU]/mL (ref 1.5–9.3)

## 2023-08-27 LAB — CORTISOL: Cortisol, Plasma: 24.1 ug/dL — ABNORMAL HIGH

## 2023-08-27 LAB — TESTOSTERONE TOTAL,FREE,BIO, MALES
Albumin: 4.5 g/dL (ref 3.6–5.1)
Sex Hormone Binding: 25 nmol/L (ref 10–50)
Testosterone, Bioavailable: 134.5 ng/dL (ref 110.0–575.0)
Testosterone, Free: 65.4 pg/mL (ref 46.0–224.0)
Testosterone: 401 ng/dL (ref 250–827)

## 2023-08-27 LAB — IRON,TIBC AND FERRITIN PANEL
%SAT: 14 % — ABNORMAL LOW (ref 20–48)
Ferritin: 200 ng/mL (ref 38–380)
Iron: 47 ug/dL — ABNORMAL LOW (ref 50–180)
TIBC: 329 ug/dL (ref 250–425)

## 2023-08-27 LAB — FOLLICLE STIMULATING HORMONE: FSH: 2.4 m[IU]/mL (ref 1.4–12.8)

## 2023-08-28 ENCOUNTER — Telehealth: Payer: Self-pay

## 2023-08-28 NOTE — Telephone Encounter (Signed)
Patient given results as directed by MD. VM left

## 2023-08-28 NOTE — Telephone Encounter (Signed)
-----   Message from [place] Thapa sent at 08/28/2023  8:15 AM EST ----- Please notify patient of labs reviewed : Testosterone level is normal 401 in the mid normal range, other parameters of testosterone including free and bioavailable testosterone are also normal.  Testosterone secretion controlling hormones LH and FSH are normal as well.  Iron studies acceptable.  Prolactin normal.  Cortisol level is mildly elevated.  Unclear reason at this time.  I would like to recheck cortisol level in 1 to 2 weeks in the early morning and fasting.  I have placed order.  Please arrange for the lab.

## 2023-09-01 ENCOUNTER — Other Ambulatory Visit: Payer: Self-pay

## 2023-09-03 ENCOUNTER — Other Ambulatory Visit: Payer: Self-pay

## 2023-09-03 DIAGNOSIS — R7989 Other specified abnormal findings of blood chemistry: Secondary | ICD-10-CM

## 2023-09-09 ENCOUNTER — Other Ambulatory Visit: Payer: No Typology Code available for payment source

## 2023-09-10 LAB — CORTISOL: Cortisol, Plasma: 24.5 ug/dL — ABNORMAL HIGH

## 2023-09-14 ENCOUNTER — Telehealth: Payer: Self-pay

## 2023-09-14 ENCOUNTER — Other Ambulatory Visit: Payer: Self-pay

## 2023-09-14 ENCOUNTER — Other Ambulatory Visit: Payer: Self-pay | Admitting: Endocrinology

## 2023-09-14 DIAGNOSIS — R7989 Other specified abnormal findings of blood chemistry: Secondary | ICD-10-CM

## 2023-09-14 MED ORDER — DEXAMETHASONE 1 MG PO TABS
1.0000 mg | ORAL_TABLET | Freq: Once | ORAL | 0 refills | Status: AC
  Filled 2023-09-14 – 2023-09-28 (×2): qty 1, 1d supply, fill #0

## 2023-09-14 NOTE — Telephone Encounter (Signed)
-----   Message from [place] Thapa sent at 09/14/2023  2:48 PM EST ----- He continued to have elevated cortisol level.  I would like to check confirmatory test with overnight 1 mg dexamethasone suppression test.  I have placed orders.  I have sent dexamethasone to his pharmacy.  Please advise patient as follows.  And arrange for lab visit in the morning fasting.  1 mg dexamethasone overnight suppression test. Instruction: Take 1 mg dexamethasone tablet at 11 PM ( timing is important) and have blood work for cortisol test in the next morning at 8 AM with fasting (timing is important).

## 2023-09-14 NOTE — Telephone Encounter (Signed)
 Patient given results and medication changes as directed by MD. No further questions at this time. Patient transferred to front desk to place lab appointment.

## 2023-09-22 ENCOUNTER — Ambulatory Visit: Payer: No Typology Code available for payment source | Admitting: Endocrinology

## 2023-09-22 ENCOUNTER — Other Ambulatory Visit: Payer: Self-pay

## 2023-09-28 ENCOUNTER — Other Ambulatory Visit: Payer: Self-pay

## 2023-09-30 ENCOUNTER — Other Ambulatory Visit: Payer: No Typology Code available for payment source

## 2023-10-01 LAB — CORTISOL: Cortisol, Plasma: 3.8 ug/dL

## 2023-10-02 ENCOUNTER — Telehealth: Payer: Self-pay | Admitting: Endocrinology

## 2023-10-02 ENCOUNTER — Other Ambulatory Visit: Payer: Self-pay

## 2023-10-02 DIAGNOSIS — R7989 Other specified abnormal findings of blood chemistry: Secondary | ICD-10-CM

## 2023-10-02 MED ORDER — DEXAMETHASONE 1 MG PO TABS
ORAL_TABLET | ORAL | 0 refills | Status: DC
  Filled 2023-10-02: qty 1, 1d supply, fill #0

## 2023-10-02 NOTE — Telephone Encounter (Signed)
 Latest Reference Range & Units 09/30/23 08:17  Cortisol, Plasma mcg/dL 3.8   Called and talked with the patient.  He had overnight 1 mg dexamethasone  suppression test.  He confirms that he had taken dexamethasone  1 mg on January 7 at 11 PM before bedtime.  Test result for cortisol is indeterminate with a level of 3.8 after taking dexamethasone .  No overt features of hypercortisolism.  He has complaints of muscle soreness, lethargy, low energy and low motivation.  He is still concerned about having low testosterone .  His testosterone  was normal in December of 4, 2024.  Plan: -In regard to cortisol I would like to recheck, dexamethasone  suppression test 1 mg overnight with dexamethasone  level. -Check testosterone  lab  Will complete lab in 2 months and have follow-up visit after test results.  Nursing: Please arrange for lab visit in 2 months in the morning fasting.  Advised to take dexamethasone  1 mg at 11 PM and have lab the following morning fasting. -Make follow-up appointment with me in 2 months about 1 week after the lab visit.  Loraina Stauffer, MD [facility] Endocrinology [facility] [address] [place], [address] [place], [place] [address] Phone # [phone]

## 2023-10-14 ENCOUNTER — Other Ambulatory Visit: Payer: Self-pay

## 2023-12-10 ENCOUNTER — Other Ambulatory Visit: Payer: Self-pay

## 2023-12-16 ENCOUNTER — Other Ambulatory Visit: Payer: No Typology Code available for payment source

## 2023-12-24 ENCOUNTER — Telehealth: Payer: Self-pay

## 2023-12-24 NOTE — Telephone Encounter (Signed)
-----   Message from [place] Thapa sent at 12/24/2023  3:12 PM EDT ----- Please notify patient of labs reviewed cortisol level still elevated.  The plan was to complete overnight 1 mg dexamethasone suppression test however seems like he did not take dexamethasone to complete this cortisol test.  Total testosterone is low.  He had normal level in December.  Please make follow-up appointment with me in few weeks as per availability to discuss test results and make further plan.

## 2023-12-24 NOTE — Telephone Encounter (Signed)
 Patient given results  as directed by MD. No further questions at this time. Patient transferred to front desk to place lab appointment.

## 2023-12-25 LAB — TESTOSTERONE TOTAL,FREE,BIO, MALES
Albumin: 4.6 g/dL (ref 3.6–5.1)
Sex Hormone Binding: 26 nmol/L (ref 10–50)
Testosterone: 188 ng/dL — ABNORMAL LOW (ref 250–827)

## 2023-12-25 LAB — CORTISOL: Cortisol, Plasma: 24.1 ug/dL — ABNORMAL HIGH

## 2023-12-25 LAB — DEXAMETHASONE, BLOOD: Dexamethasone, Serum: <20 ng/dL

## 2023-12-30 ENCOUNTER — Ambulatory Visit (INDEPENDENT_AMBULATORY_CARE_PROVIDER_SITE_OTHER): Admitting: Endocrinology

## 2023-12-30 ENCOUNTER — Encounter: Payer: Self-pay | Admitting: Endocrinology

## 2023-12-30 VITALS — BP 142/82 | HR 69 | Resp 16 | Ht 73.0 in | Wt 204.2 lb

## 2023-12-30 DIAGNOSIS — R7989 Other specified abnormal findings of blood chemistry: Secondary | ICD-10-CM | POA: Diagnosis not present

## 2023-12-30 NOTE — Progress Notes (Signed)
 Outpatient Endocrinology Note Kyle Rondal Vandevelde, MD  12/30/23  Patient's Name: Kyle Black    DOB: Mar 28, 1986    MRN: [id number]  REASON OF VISIT: Follow-up for low testosterone and elevated cortisol.  REFERRING PROVIDER: Concha Pyo, MD  PCP:  [facility]  HISTORY OF PRESENT ILLNESS:   Kyle Black is a 38 y.o. old male with past medical history listed below, is here for follow-up for low testosterone and elevated cortisol.  Pertinent Hx: Initial endocrinology consult in November 2024 for low testosterone. Patient has history of depression and used to take citalopram, following with primary care provider however he taper off citalopram in early 2024.  After being off of citalopram, he has noticed increased fatigue, low libido and erectile dysfunction.  Denies any breast tissue or nipple discharge.He was evaluated by primary care provider and had lab with total testosterone level was 239 on July 01, 2023 at 7:37 AM which was checked at [facility] clinic, and referred to endocrinology for further evaluation and management.  In October 2024 TSH was normal 1.74.  He has no thyroid disorder.  Referral medical record from [place] clinic reviewed and scanned into media.  Libido:                          Decreased  Erectile Dysfunction: Yes Gynecomastia:           No Fathered any child:     Yes, daughter 7 years in 2024. No plan for children in the future.  Shaving less often:     No Opioid use:                  No Decreased strength    yes Decreased Muscle      No Tiredness/Fatigue       Yes Anabolic steroids:        No Weight lifting:              No Excessive exercise:    No Fragility fractures:                    No Vision issues               No Sense of Smell           Intact Truama, Radiation, Mumps  No  # August 26, 2023 laboratory evaluation with total testosterone of 401, normal free and bioavailable testosterone along with normal gonadotropins,  prolactin.  Acceptable iron studies.   Latest Reference Range & Units 08/26/23 08:12  LH 1.5 - 9.3 mIU/mL 1.5  FSH 1.4 - 12.8 mIU/mL 2.4  Prolactin 2.0 - 18.0 ng/mL 4.9  Sex Horm Binding Glob, Serum 10 - 50 nmol/L 25  Testosterone 250 - 827 ng/dL 409  Testosterone, Bioavailable 110.0 - 575.0 ng/dL 811.9  Testosterone Free 46.0 - 224.0 pg/mL 65.4   # Elevated serum cortisol level : During low testosterone evaluation, found to have mildly elevated total cortisol with level of 24 and hide normal mL of 22 at multiple occasions.  1 mg dexamethasone suppression test in September 30, 2023 with cortisol of 3.8 indeterminate.  He has no features of Cushing syndrome/hypercortisolism.  Interval history Patient presented for the follow-up of low testosterone and elevated cortisol.  Recent laboratory result with low total testosterone of 188.  He continued to have a mildly elevated total cortisol of 24 he is a stable.  Cortisol twice but was plan to do with  1 mg dexamethasone suppression overnight test however he did not take dexamethasone this time.   Latest Reference Range & Units 12/16/23 08:03  Cortisol, Plasma mcg/dL 40.9 (H)  Sex Horm Binding Glob, Serum 10 - 50 nmol/L 26  Testosterone 250 - 827 ng/dL 811 (L)  Testosterone, Bioavailable 110.0 - 575.0 ng/dL Pend  Testosterone Free 46.0 - 224.0 pg/mL See below  (H): Data is abnormally high (L): Data is abnormally low  Patient continued to complain of lobe libido, erectile dysfunction, low motivation.  He also complains of muscle soreness.  He has no proximal muscle weakness, no easy bruising.  He works out 5 days a week and physically quite active.  He has a stress in her life.  No other complaints today.  He wants to restart testosterone therapy with having low testosterone level and also having symptoms affecting his quality of life.  REVIEW OF SYSTEMS:  As per history of present illness.   PAST MEDICAL HISTORY: Past Medical History:   Diagnosis Date   Anxiety and depression     PAST SURGICAL HISTORY: Past Surgical History:  Procedure Laterality Date   HAND SURGERY Right 03/2004   right hand fracture   IRRIGATION AND DEBRIDEMENT KNEE Left 09/24/2019   Procedure: IRRIGATION AND DEBRIDEMENT KNEE;  Surgeon: Kathryne Hitch, MD;  Location: [facility];  Service: Orthopedics;  Laterality: Left;    ALLERGIES: No Known Allergies  FAMILY HISTORY:  Family History  Problem Relation Age of Onset   Healthy Mother    Healthy Father     SOCIAL HISTORY: Social History   Socioeconomic History   Marital status: Single    Spouse name: Not on file   Number of children: Not on file   Years of education: Not on file   Highest education level: Not on file  Occupational History   Not on file  Tobacco Use   Smoking status: Former   Smokeless tobacco: Current    Types: Chew  Substance and Sexual Activity   Alcohol use: Yes    Comment: occassionally   Drug use: No   Sexual activity: Not on file  Other Topics Concern   Not on file  Social History Narrative   Not on file   Social Drivers of Health   Financial Resource Strain: Not on file  Food Insecurity: Not on file  Transportation Needs: Not on file  Physical Activity: Not on file  Stress: Not on file  Social Connections: Not on file    MEDICATIONS:  Current Outpatient Medications  Medication Sig Dispense Refill   citalopram (CELEXA) 40 MG tablet Take 40 mg by mouth daily.     dexamethasone (DECADRON) 1 MG tablet Take 1 tablet by mouth once at 11 pm, before coming for labs at 8 am the next morning (Patient not taking: Reported on 12/30/2023) 1 tablet 0   diclofenac Sodium (VOLTAREN) 1 % GEL Apply 2 g topically 4 (four) times daily. (Patient not taking: Reported on 12/30/2023) 100 g 0   doxycycline (VIBRAMYCIN) 100 MG capsule Take 1 capsule (100 mg total) by mouth 2 (two) times daily. (Patient not taking: Reported on 12/30/2023) 30 capsule 0    HYDROcodone-acetaminophen (NORCO) 5-325 MG tablet Take 1-2 tablets by mouth every 6 (six) hours as needed for moderate pain. (Patient not taking: Reported on 12/30/2023) 30 tablet 0   methocarbamol (ROBAXIN) 500 MG tablet Take 1 tablet (500 mg total) by mouth 2 (two) times daily. (Patient not taking: Reported on 12/30/2023) 20 tablet  0   No current facility-administered medications for this visit.    PHYSICAL EXAM: Vitals:   12/30/23 1307 12/30/23 1308  BP: (!) 142/82 (!) 142/82  Pulse: 69   Resp: 16   SpO2: 98%   Weight: 204 lb 3.2 oz (92.6 kg)   Height: 6\' 1"  (1.854 m)     Body mass index is 26.94 kg/m.  Wt Readings from Last 3 Encounters:  12/30/23 204 lb 3.2 oz (92.6 kg)  08/19/23 199 lb 6.4 oz (90.4 kg)  09/23/19 214 lb 12.8 oz (97.4 kg)    General: Well developed, well nourished male in no apparent distress. Non-Cushingoid. No obvious Klinefelter's syndrome body habitus HEENT: AT/[place], no external lesions. Hearing intact to the spoken word Eyes: EOMI. No proptosis, stare and lid lag. Conjunctiva clear and no icterus. Visual field grossly intact in confrontation Neck: Trachea midline, neck supple without appreciable thyromegaly or lymphadenopathy and no palpable thyroid nodules Lungs: Clear to auscultation, no wheeze. Respirations not labored Heart: S1S2, Regular in rate and rhythm.  Abdomen: Soft, non tender, non distended.  No stridor. Neurologic: Alert, oriented, normal speech, deep tendon biceps reflexes normal,  no gross focal neurological deficit. No obvious proximal weakness Extremities: No pedal pitting edema, no tremors of outstretched hands, no excessive hair growth Skin: Warm, color good. No sores or rashes noted.  No bruises. Psychiatric: Does not appear depressed or anxious   PERTINENT HISTORIC LABORATORY AND IMAGING STUDIES:  All pertinent laboratory results were reviewed. Please see HPI also for further details.   ASSESSMENT / PLAN  1. Low testosterone in male    2. Elevated morning serum cortisol level    # Low Testosterone : Mr. Hammitt is a 38 y.o. old male with erectile dysfunction /low libido who was found to have low total testosterone levels in one occasion, was 239 in the morning.  However repeat total testosterone was 401 with normal gonadotropins, prolactin and iron studies in December 2024.  -He again had total testosterone of 188 low in the early morning recently.  He continued to have symptoms of low libido, erectile dysfunction. -He wants to start testosterone therapy.  He has no plan for fathering a child. Discussed that testosterone therapy can suppress endogenous testosterone synthesis and potentially can have fertility issues if he plan for baby in the future.  Plan: -With low testosterone and has symptoms concerning for hypogonadism. -I would like to repeat total and free testosterone along with gonadotropins.  If suggesting secondary hypogonadism we will plan for MRI pituitary. -After the workup is complete we will plan for testosterone therapy.  # Elevated cortisol -He continued to have mildly elevated total cortisol in the early morning and 24 range.  With the upper normal limit of 22.  He has no features of hypercortisolism /Cushing's syndrome. -He had indeterminate result with 1 mg dexamethasone suppression test with cortisol of 3.8.  Plan: -I would like to check late-night salivary cortisol test x 2 instructions provided. -I would like to check ACTH level with next set of lab.  Patient will complete labs in the early morning fasting.  Diagnoses and all orders for this visit:  Low testosterone in male -     Testosterone, Total, LC/MS/MS -     Testosterone, Free, LC/MS/MS -     Follicle stimulating hormone -     Luteinizing hormone  Elevated morning serum cortisol level -     Salivary Cortisol -     Salivary Cortisol -  ACTH     DISPOSITION Follow up in clinic in to be determined based on above lab  results.  All questions answered and patient verbalized understanding of the plan.  Kyle Laval Cafaro, MD [facility] [address] [place], [address] [place], [place] [address] Phone # [phone]  At least part of this note was generated using voice recognition software. Inadvertent word errors may have occurred, which were not recognized during the proofreading process.

## 2023-12-30 NOTE — Patient Instructions (Addendum)
 Please complete lab early morning and fasting.   Saliva : Collect saliva at 11 PM for two different night.

## 2024-01-06 ENCOUNTER — Other Ambulatory Visit

## 2024-01-13 ENCOUNTER — Other Ambulatory Visit: Payer: Self-pay | Admitting: Endocrinology

## 2024-01-13 ENCOUNTER — Telehealth: Payer: Self-pay

## 2024-01-13 DIAGNOSIS — E230 Hypopituitarism: Secondary | ICD-10-CM

## 2024-01-13 NOTE — Telephone Encounter (Signed)
 VM left requesting call back to give lab results as directed by MD.

## 2024-01-13 NOTE — Telephone Encounter (Signed)
-----   Message from [place] Thapa sent at 01/13/2024  1:23 PM EDT ----- Please notify patient of labs reviewed total testosterone  is low normal at 299, free testosterone  is low normal at 52.2.  Gonadotropins low LH and low normal FSH.  ACTH  which controls cortisol secretion is normal.  Salivary cortisol result awaited.  I would like to check MRI pituitary for workup of secondary hypogonadism.  Due to mainly low gonadotropins with low normal testosterone  level.  I placed order.  Please inform the patient.

## 2024-01-14 ENCOUNTER — Telehealth: Payer: Self-pay

## 2024-01-14 NOTE — Telephone Encounter (Signed)
-----   Message from [place] Thapa sent at 01/13/2024  1:23 PM EDT ----- Please notify patient of labs reviewed total testosterone  is low normal at 299, free testosterone  is low normal at 52.2.  Gonadotropins low LH and low normal FSH.  ACTH  which controls cortisol secretion is normal.  Salivary cortisol result awaited.  I would like to check MRI pituitary for workup of secondary hypogonadism.  Due to mainly low gonadotropins with low normal testosterone  level.  I placed order.  Please inform the patient.

## 2024-01-14 NOTE — Telephone Encounter (Signed)
 Patient given results  as directed by MD. No further questions at this time.  MRI scheduled for 01/28/24 per patient

## 2024-01-28 ENCOUNTER — Ambulatory Visit
Admission: RE | Admit: 2024-01-28 | Discharge: 2024-01-28 | Disposition: A | Discharge status: Home / Self Care | Source: Ambulatory Visit | Attending: Endocrinology | Admitting: Endocrinology

## 2024-01-28 DIAGNOSIS — E230 Hypopituitarism: Secondary | ICD-10-CM

## 2024-01-28 MED ORDER — GADOPICLENOL 0.5 MMOL/ML IV SOLN
9.0000 mL | Freq: Once | INTRAVENOUS | Status: AC | PRN
  Administered 2024-01-28: 9 mL via INTRAVENOUS

## 2024-02-05 LAB — FOLLICLE STIMULATING HORMONE: FSH: 2.4 m[IU]/mL (ref 1.4–12.8)

## 2024-02-05 LAB — LUTEINIZING HORMONE: LH: 0.9 m[IU]/mL — ABNORMAL LOW (ref 1.5–9.3)

## 2024-02-05 LAB — ACTH: C206 ACTH: 27 pg/mL (ref 6–50)

## 2024-02-05 LAB — CORTISOL, LC/MS, SALIVA, 2 SAMPLES: Cortisol Savliva Sample: 0.08 ug/dL

## 2024-02-05 LAB — TESTOSTERONE, FREE: TESTOSTERONE FREE: 52.2 pg/mL (ref 46.0–224.0)

## 2024-02-05 LAB — TESTOSTERONE, TOTAL, LC/MS/MS: Testosterone, Total, LC-MS-MS: 299 ng/dL (ref 250–1100)

## 2024-02-08 ENCOUNTER — Other Ambulatory Visit: Payer: Self-pay | Admitting: Endocrinology

## 2024-02-08 ENCOUNTER — Telehealth: Payer: Self-pay

## 2024-02-08 DIAGNOSIS — R7989 Other specified abnormal findings of blood chemistry: Secondary | ICD-10-CM

## 2024-02-08 NOTE — Telephone Encounter (Signed)
 Patient's salivary cortisol test was insufficient. Per MD would like patient to repeat x 1. Patient called and left VM. New order given to lab tech

## 2024-02-10 ENCOUNTER — Telehealth: Payer: Self-pay

## 2024-02-10 ENCOUNTER — Ambulatory Visit: Payer: Self-pay | Admitting: Endocrinology

## 2024-02-10 NOTE — Telephone Encounter (Signed)
 Called patient and talked about test results and where at this time waiting for radiology official report for pituitary MRI.  We will try to get in touch with her [facility].  He had completed MRI pituitary on May 8 and and still awaited.  Will make a f further plan most likely testosterone  therapy after the MRI report.

## 2024-02-10 NOTE — Telephone Encounter (Signed)
 [facility]  called to follow up on status of patient's results for MRI done on 5/8 as directed by MD. VM left requesting call back.

## 2024-02-10 NOTE — Telephone Encounter (Signed)
 Patient left message requesting a call back to get a message to MD. RN called patient back and patient stated he was getting annoyed with the amount of testing he has to do and would like to discuss wit MD next steps of treatment. Patient request call from MD.

## 2024-02-11 ENCOUNTER — Telehealth: Payer: Self-pay

## 2024-02-11 NOTE — Telephone Encounter (Signed)
 [facility] called to request status on patient's MRI results. Left VM requesting call back

## 2024-02-11 NOTE — Telephone Encounter (Signed)
 Kyle Black called from [facility] imaging stating that the results still have not been read but can call the reading room at [phone] to have it read via phone.

## 2024-02-12 ENCOUNTER — Other Ambulatory Visit: Payer: Self-pay

## 2024-02-12 ENCOUNTER — Ambulatory Visit: Payer: Self-pay | Admitting: Endocrinology

## 2024-02-12 DIAGNOSIS — E230 Hypopituitarism: Secondary | ICD-10-CM

## 2024-02-12 MED ORDER — TESTOSTERONE CYPIONATE 100 MG/ML IM SOLN
100.0000 mg | INTRAMUSCULAR | 0 refills | Status: DC
  Filled 2024-02-12: qty 10, 140d supply, fill #0

## 2024-02-12 MED ORDER — SYRINGE 23G X 1" 3 ML MISC
1 refills | Status: DC
  Filled 2024-02-12: qty 50, 100d supply, fill #0

## 2024-02-17 ENCOUNTER — Telehealth: Payer: Self-pay

## 2024-02-17 ENCOUNTER — Other Ambulatory Visit: Payer: Self-pay

## 2024-02-17 ENCOUNTER — Ambulatory Visit (INDEPENDENT_AMBULATORY_CARE_PROVIDER_SITE_OTHER)

## 2024-02-17 VITALS — BP 150/100 | HR 97 | Resp 20 | Ht 73.0 in | Wt 204.8 lb

## 2024-02-17 DIAGNOSIS — R7989 Other specified abnormal findings of blood chemistry: Secondary | ICD-10-CM

## 2024-02-17 DIAGNOSIS — E230 Hypopituitarism: Secondary | ICD-10-CM

## 2024-02-17 MED ORDER — TESTOSTERONE CYPIONATE 100 MG/ML IM SOLN
100.0000 mg | INTRAMUSCULAR | Status: DC
  Administered 2024-02-17: 100 mg via INTRAMUSCULAR

## 2024-02-17 NOTE — Telephone Encounter (Signed)
 Patient called stating he is supposed to start his testosterone  medication every two weeks.  I looked at the notes and it displays lab and testosterone  appointment but I do not see anything for every two weeks.  I also see where the rx was sent on the 23rd.  Am I supposed to schedule the patient for the lab or provider before he picks up his medication for instructions? . Please f/u

## 2024-02-17 NOTE — Progress Notes (Signed)
 After obtaining consent, and per orders of Dr. Erroll Luna, injection of Testosterone given by Tera Partridge. Patient instructed to remain in clinic for 20 minutes afterwards, and to report any adverse reaction to me immediately.

## 2024-02-17 NOTE — Telephone Encounter (Signed)
 Patient verbalized his rx should go to [facility].  Patient is scheduled to come in today at 2:45 p.m.  Patient is aware to ring medication for 2:45 appointment

## 2024-02-17 NOTE — Telephone Encounter (Signed)
 Disregard about sending rx to [facility] patient was unaware the pharmacy is beside us  and I provided the suite number to the patient for the pharmacy.

## 2024-04-06 ENCOUNTER — Telehealth: Payer: Self-pay

## 2024-04-06 ENCOUNTER — Other Ambulatory Visit (HOSPITAL_COMMUNITY): Payer: Self-pay

## 2024-04-06 MED ORDER — ERYTHROMYCIN 5 MG/GM OP OINT
1.0000 | TOPICAL_OINTMENT | Freq: Four times a day (QID) | OPHTHALMIC | 0 refills | Status: AC
  Filled 2024-04-06: qty 3.5, 7d supply, fill #0
  Filled 2024-04-06: qty 3.5, 1d supply, fill #0

## 2024-04-06 NOTE — Telephone Encounter (Signed)
 Per our conversation please send in an antibiotic for the stye on my left eye.  Please send to [facility] pharmacy.

## 2024-05-02 ENCOUNTER — Telehealth: Payer: Self-pay | Admitting: Endocrinology

## 2024-05-02 DIAGNOSIS — E230 Hypopituitarism: Secondary | ICD-10-CM

## 2024-05-02 NOTE — Telephone Encounter (Signed)
 Patient called stating concerns over the current injections he's receiving and feels like the frequency of getting them needs to change.Stating he feels ok for a few days then crashes and feels awful.Please advise.

## 2024-05-04 NOTE — Telephone Encounter (Signed)
 VM left advising patient to call office to schedule lab appointment for a week after his injection. Will retry again at later date/time

## 2024-05-04 NOTE — Telephone Encounter (Signed)
 Last to the lab for testosterone level in 1 week after the injection.  Advised to do lab in early morning fasting.  Okay to do lab or earlier if it works for the patient.  Based on the lab we can decide either to decrease duration of injection or increase the dose.  Arrange for lab visit accordingly.  [place] Kyle Waldrop, MD [facility] [address] [place], [address] [place], [place] [address] Phone # [phone]

## 2024-05-11 ENCOUNTER — Other Ambulatory Visit

## 2024-05-16 LAB — TESTOSTERONE, FREE & TOTAL
Free Testosterone: 85.1 pg/mL (ref 35.0–155.0)
Testosterone, Total, LC-MS-MS: 357 ng/dL (ref 250–1100)

## 2024-05-17 ENCOUNTER — Ambulatory Visit: Payer: Self-pay | Admitting: Endocrinology

## 2024-05-17 DIAGNOSIS — E230 Hypopituitarism: Secondary | ICD-10-CM

## 2024-05-17 DIAGNOSIS — Z5181 Encounter for therapeutic drug level monitoring: Secondary | ICD-10-CM

## 2024-05-18 ENCOUNTER — Other Ambulatory Visit: Payer: Self-pay | Admitting: Endocrinology

## 2024-05-18 ENCOUNTER — Other Ambulatory Visit: Payer: Self-pay

## 2024-05-18 DIAGNOSIS — E230 Hypopituitarism: Secondary | ICD-10-CM

## 2024-05-18 MED ORDER — TESTOSTERONE CYPIONATE 100 MG/ML IM SOLN
100.0000 mg | INTRAMUSCULAR | 3 refills | Status: DC
  Filled 2024-05-18: qty 10, 70d supply, fill #0

## 2024-05-20 ENCOUNTER — Other Ambulatory Visit

## 2024-05-27 ENCOUNTER — Ambulatory Visit: Admitting: Endocrinology

## 2024-06-03 ENCOUNTER — Telehealth: Payer: Self-pay | Admitting: Endocrinology

## 2024-06-03 DIAGNOSIS — E230 Hypopituitarism: Secondary | ICD-10-CM

## 2024-06-03 NOTE — Telephone Encounter (Signed)
 Patient is calling to say that he is having trouble getting his prescription for testosterone  cypionate (DEPOTESTOTERONE CYPIONATE) 100 MG/ML Patient states that he is getting his prescription from the [facility] in [place], but they need an updated prescription due to the dosage change and they also need notes from his last visit with Dr. Mercie.

## 2024-06-06 ENCOUNTER — Other Ambulatory Visit: Payer: Self-pay

## 2024-06-06 DIAGNOSIS — E230 Hypopituitarism: Secondary | ICD-10-CM

## 2024-06-06 MED ORDER — TESTOSTERONE CYPIONATE 100 MG/ML IM SOLN: 100.0000 mg | INTRAMUSCULAR | 3 refills | Status: DC

## 2024-06-06 NOTE — Telephone Encounter (Signed)
 [facility] can [facility] prescription for testosterone  [facility]  I had adjusted the prescription on August 26.

## 2024-06-07 ENCOUNTER — Telehealth: Payer: Self-pay

## 2024-06-07 NOTE — Telephone Encounter (Signed)
 Patient called and sent Mychart message to get the phone and fax info of the [facility] he uses in order to send his prescription.

## 2024-06-24 ENCOUNTER — Other Ambulatory Visit

## 2024-06-29 LAB — TESTOSTERONE, FREE & TOTAL

## 2024-06-29 LAB — AST: AST: 28 U/L (ref 10–40)

## 2024-06-29 LAB — HEMATOCRIT: HCT: 49.5 % (ref 38.5–50.0)

## 2024-06-29 LAB — PSA: PSA: 0.36 ng/mL (ref <=4.00)

## 2024-07-07 ENCOUNTER — Ambulatory Visit: Admitting: Endocrinology

## 2024-07-07 ENCOUNTER — Other Ambulatory Visit

## 2024-07-20 ENCOUNTER — Other Ambulatory Visit

## 2024-07-24 LAB — TESTOSTERONE, TOTAL, LC/MS/MS: Testosterone, Total, LC-MS-MS: 643 ng/dL (ref 250–1100)

## 2024-07-29 ENCOUNTER — Other Ambulatory Visit: Payer: Self-pay

## 2024-07-29 ENCOUNTER — Encounter: Payer: Self-pay | Admitting: Endocrinology

## 2024-07-29 ENCOUNTER — Telehealth (INDEPENDENT_AMBULATORY_CARE_PROVIDER_SITE_OTHER): Admitting: Endocrinology

## 2024-07-29 DIAGNOSIS — E230 Hypopituitarism: Secondary | ICD-10-CM

## 2024-07-29 MED ORDER — TESTOSTERONE CYPIONATE 100 MG/ML IM SOLN
50.0000 mg | INTRAMUSCULAR | 1 refills | Status: DC
  Filled 2024-07-29: qty 10, 70d supply, fill #0

## 2024-07-29 MED ORDER — TESTOSTERONE CYPIONATE 100 MG/ML IM SOLN: 50.0000 mg | INTRAMUSCULAR | 3 refills | Status: AC

## 2024-07-29 MED ORDER — SYRINGE 23G X 1" 3 ML MISC
3 refills | Status: AC
  Filled 2024-07-29: qty 50, 100d supply, fill #0

## 2024-07-29 NOTE — Progress Notes (Addendum)
 Outpatient Endocrinology Note Avangeline Stockburger, MD  07/30/24  Patient's Name: Kyle Black    DOB: 01/23/1986    MRN: [id number]  REASON OF VISIT: Follow-up for hypogonadism  REFERRING PROVIDER: Salman, Faiza, MD  PCP:  [facility]  I connected with  Penne Cera on 07/30/24 by a video enabled telemedicine application and verified that I am speaking with the correct person using two identifiers.   I discussed the limitations of evaluation and management by telemedicine. The patient expressed understanding and agreed to proceed.   Provider location: office Patient location: home  Video visit requested by patient.   HISTORY OF PRESENT ILLNESS:   Muaad Boehning is a 38 y.o. old male with past medical history listed below, is here for follow-up for hypogonadism.  Pertinent Hx: Patient has hypogonadotropic hypogonadism with normal MRI pituitary, started on testosterone  injection therapy in May 2025.  Background: Initial endocrinology consult in November 2024 for low testosterone . Patient has history of depression and used to take citalopram , following with primary care provider however he tapered off citalopram  in early 2024.  After being off of citalopram , he has noticed increased fatigue, low libido and erectile dysfunction.  Denies any breast tissue or nipple discharge.He was evaluated by primary care provider and had lab with total testosterone  level was 239 on July 01, 2023 at 7:37 AM which was checked at [facility] clinic, and referred to endocrinology for further evaluation and management.  In October 2024 TSH was normal 1.74.  He has no thyroid disorder.  Referral medical record from [place] clinic reviewed and scanned into media.  Libido:                          Decreased  Erectile Dysfunction: Yes Gynecomastia:           No Fathered any child:     Yes, daughter 7 years in 2024. No plan for children in the future.  Shaving less often:     No Opioid use:                   No Decreased strength    yes Decreased Muscle      No Tiredness/Fatigue       Yes Anabolic steroids:        No Weight lifting:              No Excessive exercise:    No Fragility fractures:                    No Vision issues               No Sense of Smell           Intact Truama, Radiation, Mumps  No  # August 26, 2023 laboratory evaluation with total testosterone  of 401, normal free and bioavailable testosterone  along with normal gonadotropins, prolactin.  Acceptable iron studies.  April 2025:  labs reviewed total testosterone  is low normal at 299, free testosterone  is low normal at 52.2. Gonadotropins low LH and low normal FSH. Concern for secondary hypogonadism.    May 2025: MRI pituitary : normal pituitary size and shape with homogenous enhancement.   In May 2025: started on testosterone  cypionate initial dose 100 mg every 3 weeks, with no optimal improvement of symptoms, increase to 100 mg weekly in August 2025.  ACTH  which controls cortisol secretion is normal. Salivary cortisol result awaited.   # Elevated serum cortisol  level : During low testosterone  evaluation, found to have mildly elevated total cortisol with level of 24 and high normal of 22 at multiple occasions.  ACTH  was normal.  1 mg dexamethasone  suppression test in September 30, 2023 with cortisol of 3.8 indeterminate.  Salivary cortisol late-night was normal 0.08.  He has no features of Cushing syndrome/hypercortisolism.  Interval history Patient has been taking testosterone  cypionate 100 mg every week, he reports he started to have significant fatigue after 3 to 4 days of the injection.  Patient lab TOTAL testosterone  643, injection of testosterone  was on prior night.  He is wondering about injecting twice a week.  No other complaints today.  Recent lab results normal hematocrit, PSA and AST.  This is virtual MyChart video visit today.   Latest Reference Range & Units 07/20/24 07:53   Testosterone , Total, LC-MS-MS 250 - 1,100 ng/dL 356   REVIEW OF SYSTEMS:  As per history of present illness.   PAST MEDICAL HISTORY: Past Medical History:  Diagnosis Date   Anxiety and depression     PAST SURGICAL HISTORY: Past Surgical History:  Procedure Laterality Date   HAND SURGERY Right 03/2004   right hand fracture   IRRIGATION AND DEBRIDEMENT KNEE Left 09/24/2019   Procedure: IRRIGATION AND DEBRIDEMENT KNEE;  Surgeon: Vernetta Lonni GRADE, MD;  Location: [facility];  Service: Orthopedics;  Laterality: Left;    ALLERGIES: No Known Allergies  FAMILY HISTORY:  Family History  Problem Relation Age of Onset   Healthy Mother    Healthy Father     SOCIAL HISTORY: Social History   Socioeconomic History   Marital status: Single    Spouse name: Not on file   Number of children: Not on file   Years of education: Not on file   Highest education level: Not on file  Occupational History   Not on file  Tobacco Use   Smoking status: Former   Smokeless tobacco: Current    Types: Chew  Substance and Sexual Activity   Alcohol use: Yes    Comment: occassionally   Drug use: No   Sexual activity: Not on file  Other Topics Concern   Not on file  Social History Narrative   Not on file   Social Drivers of Health   Financial Resource Strain: Not on file  Food Insecurity: Not on file  Transportation Needs: Not on file  Physical Activity: Not on file  Stress: Not on file  Social Connections: Not on file    MEDICATIONS:  Current Outpatient Medications  Medication Sig Dispense Refill   Syringe/Needle, Disp, (SYRINGE 3CC/23GX1) 23G X 1 3 ML MISC Use once every week to inject testosterone  50 each 3   [START ON 08/01/2024] testosterone  cypionate (DEPOTESTOTERONE CYPIONATE) 100 MG/ML injection Inject 0.5 mLs (50 mg total) into the muscle 2 (two) times a week. For IM use only 10 mL 3   No current facility-administered medications for this visit.    PHYSICAL  EXAM: There were no vitals filed for this visit.   There is no height or weight on file to calculate BMI.  Wt Readings from Last 3 Encounters:  02/17/24 204 lb 12.8 oz (92.9 kg)  12/30/23 204 lb 3.2 oz (92.6 kg)  08/19/23 199 lb 6.4 oz (90.4 kg)    General: Well developed, well nourished male in no apparent distress. HEENT: AT/[place], no external lesions. Hearing intact to the spoken word Eyes: Conjunctiva clear and no icterus.  Neurologic: Alert, oriented, normal speech Psychiatric: Does not  appear depressed or anxious   PERTINENT HISTORIC LABORATORY AND IMAGING STUDIES:  All pertinent laboratory results were reviewed. Please see HPI also for further details.    ASSESSMENT / PLAN  1. Hypogonadotropic hypogonadism    Patient has hypogonadotropic hypogonadism, laboratory evaluation with low normal total and free testosterone  with some occasion with low testosterone  and low normal gonadotropins.  He has symptoms of fatigue, erectile dysfunction and low libido.  He was started on testosterone  cypionate in May 2025 initial dose 100 mg twice a week and dose was gradually increased.  He is currently taking testosterone  cypionate 100 mg weekly. - Recent total testosterone  643 however checked 1 day after the injection.  He reports he reports significant fatigue after 3 to 4 days of the injection.  He is willing and okay to try twice with injection.  Plan: -Change testosterone  cypionate 100 mg weekly to 50 mg twice a week.  Paper prescription provided, patient will pick up and take it to the [facility] pharmacy. - Check total testosterone  in 6 months, prior to follow-up visit.  Diagnoses and all orders for this visit:  Hypogonadotropic hypogonadism -     Discontinue: testosterone  cypionate (DEPOTESTOTERONE CYPIONATE) 100 MG/ML injection; Inject 0.5 mLs (50 mg total) into the muscle 2 (two) times a week. For IM use only -     Syringe/Needle, Disp, (SYRINGE 3CC/23GX1) 23G X 1 3 ML MISC; Use once every  week to inject testosterone  -     testosterone  cypionate (DEPOTESTOTERONE CYPIONATE) 100 MG/ML injection; Inject 0.5 mLs (50 mg total) into the muscle 2 (two) times a week. For IM use only -     Testosterone , Total, LC/MS/MS    DISPOSITION Follow up in clinic in 6 months suggested.  All questions answered and patient verbalized understanding of the plan.  Malaka Ruffner, MD [facility] Endocrinology [facility] [address] [place], [address] [place], [place] [address] Phone # [phone]  At least part of this note was generated using voice recognition software. Inadvertent word errors may have occurred, which were not recognized during the proofreading process.

## 2024-08-02 ENCOUNTER — Other Ambulatory Visit: Payer: Self-pay

## 2024-08-03 ENCOUNTER — Telehealth: Payer: Self-pay | Admitting: Endocrinology

## 2024-08-03 NOTE — Telephone Encounter (Signed)
 Patient is calling to say that he received a bill for $250.00 for his 07/29/2024 virtual visit and he has [employer] and he wants to know who he needs to speak with about this.  Patient had [facility] authorization to cover the visit.  Patient was given the billing number and told to call there to start with.

## 2024-08-08 ENCOUNTER — Other Ambulatory Visit: Payer: Self-pay

## 2024-08-09 ENCOUNTER — Other Ambulatory Visit: Payer: Self-pay

## 2024-08-09 NOTE — Telephone Encounter (Addendum)
 I will, but to date I have not heard anything else from anyone and his balance still shows as owing.

## 2024-09-07 NOTE — Telephone Encounter (Signed)
 Patient states that the claim was paid by [employer].

## 2024-10-05 ENCOUNTER — Other Ambulatory Visit (HOSPITAL_COMMUNITY): Payer: Self-pay

## 2024-10-05 ENCOUNTER — Telehealth: Payer: Self-pay | Admitting: Adult Health

## 2024-10-05 MED ORDER — CEPHALEXIN 500 MG PO CAPS
500.0000 mg | ORAL_CAPSULE | Freq: Two times a day (BID) | ORAL | 0 refills | Status: AC
  Filled 2024-10-05: qty 20, 10d supply, fill #0

## 2024-10-05 NOTE — Telephone Encounter (Signed)
 Kyle Black was seen while in the office. He was complaining of sore throat and swelling on the left side for the last few days. He did not have any other symptoms. Strep test was performed and he was positive for strep. Will send in Keflex 

## 2024-10-18 ENCOUNTER — Ambulatory Visit: Admitting: Endocrinology

## 2024-10-28 ENCOUNTER — Other Ambulatory Visit

## 2024-10-28 ENCOUNTER — Encounter: Payer: Self-pay | Admitting: Endocrinology

## 2024-10-28 ENCOUNTER — Ambulatory Visit: Admitting: Endocrinology

## 2024-10-28 VITALS — BP 136/62 | HR 71 | Ht 73.0 in | Wt 206.6 lb

## 2024-10-28 DIAGNOSIS — E230 Hypopituitarism: Secondary | ICD-10-CM

## 2024-10-28 NOTE — Progress Notes (Signed)
 "  Outpatient Endocrinology Note Kyle Canavan, MD  10/28/24  Patient's Name: Kyle Black    DOB: 1986/08/05    MRN: [id number]  REASON OF VISIT: Follow-up for hypogonadism  REFERRING PROVIDER: Salman, Faiza, MD  PCP:  [facility]  HISTORY OF PRESENT ILLNESS:   Kyle Black is a 39 y.o. old male with past medical history listed below, is here for follow-up for hypogonadism.  Pertinent Hx: Patient has hypogonadotropic hypogonadism with normal MRI pituitary, started on testosterone  injection therapy in May 2025.  Background: Initial endocrinology consult in November 2024 for low testosterone . Patient has history of depression and used to take citalopram , following with primary care provider however he tapered off citalopram  in early 2024.  After being off of citalopram , he has noticed increased fatigue, low libido and erectile dysfunction.  Denies any breast tissue or nipple discharge.He was evaluated by primary care provider and had lab with total testosterone  level was 239 on July 01, 2023 at 7:37 AM which was checked at [facility] clinic, and referred to endocrinology for further evaluation and management.  In October 2024 TSH was normal 1.74.  He has no thyroid disorder.  Referral medical record from [place] clinic reviewed and scanned into media.  Libido:                          Decreased  Erectile Dysfunction: Yes Gynecomastia:           No Fathered any child:     Yes, daughter 7 years in 2024. No plan for children in the future.  Shaving less often:     No Opioid use:                  No Decreased strength    yes Decreased Muscle      No Tiredness/Fatigue       Yes Anabolic steroids:        No Weight lifting:              No Excessive exercise:    No Fragility fractures:                    No Vision issues               No Sense of Smell           Intact Truama, Radiation, Mumps  No  # August 26, 2023 laboratory evaluation with total testosterone  of  401, normal free and bioavailable testosterone  along with normal gonadotropins, prolactin.  Acceptable iron studies.  April 2025:  labs reviewed total testosterone  is low normal at 299, free testosterone  is low normal at 52.2. Gonadotropins low LH and low normal FSH. Concern for secondary hypogonadism.    May 2025: MRI pituitary : normal pituitary size and shape with homogenous enhancement.   In May 2025: started on testosterone  cypionate initial dose 100 mg every 2 weeks, with no optimal improvement of symptoms, increase to 100 mg weekly in August 2025.  And changed to 50 mg twice a week in October 2025.  # Elevated serum cortisol level : During low testosterone  evaluation, found to have mildly elevated total cortisol with level of 24 and high normal of 22 at multiple occasions.  ACTH  was normal.  1 mg dexamethasone  suppression test in September 30, 2023 with cortisol of 3.8 indeterminate.  Salivary cortisol late-night was normal 0.08.  He has no features of Cushing syndrome/hypercortisolism.  Interval history Patient  has been taking testosterone  cypionate 50 mg twice a week Tuesdays and Saturdays.  Last injection was on Tuesday.  He has been getting testosterone  cypionate 200 mg/mL from [facility].  With the twice a week injection in the beginning he felt much better however lately he has not feeling as good however he has not been having significant low energy before due for next injection.  He would like to increase the dose if possible.  He had PSA, hematocrit and AST normal in October.  Total testosterone  level was 643.  Blood pressure is normal.  No other complaints today.  REVIEW OF SYSTEMS:  As per history of present illness.   PAST MEDICAL HISTORY: Past Medical History:  Diagnosis Date   Anxiety and depression     PAST SURGICAL HISTORY: Past Surgical History:  Procedure Laterality Date   HAND SURGERY Right 03/2004   right hand fracture   IRRIGATION AND DEBRIDEMENT KNEE Left  09/24/2019   Procedure: IRRIGATION AND DEBRIDEMENT KNEE;  Surgeon: Vernetta Lonni GRADE, MD;  Location: [facility];  Service: Orthopedics;  Laterality: Left;    ALLERGIES: No Known Allergies  FAMILY HISTORY:  Family History  Problem Relation Age of Onset   Healthy Mother    Healthy Father     SOCIAL HISTORY: Social History   Socioeconomic History   Marital status: Single    Spouse name: Not on file   Number of children: Not on file   Years of education: Not on file   Highest education level: Not on file  Occupational History   Not on file  Tobacco Use   Smoking status: Former   Smokeless tobacco: Current    Types: Chew  Substance and Sexual Activity   Alcohol use: Yes    Comment: occassionally   Drug use: No   Sexual activity: Not on file  Other Topics Concern   Not on file  Social History Narrative   Not on file   Social Drivers of Health   Tobacco Use: High Risk (10/28/2024)   Patient History    Smoking Tobacco Use: Former    Smokeless Tobacco Use: Current    Passive Exposure: Not on [place]uary Strain: Not on file  Food Insecurity: Not on file  Transportation Needs: Not on file  Physical Activity: Not on file  Stress: Not on file  Social Connections: Not on file  Depression (PHQ2-9): Not on file  Alcohol Screen: Not on file  Housing: Not on file  Utilities: Not on file  Health Literacy: Not on file    MEDICATIONS:  Current Outpatient Medications  Medication Sig Dispense Refill   Syringe/Needle, Disp, (SYRINGE 3CC/23GX1) 23G X 1 3 ML MISC Use once every week to inject testosterone  50 each 3   testosterone  cypionate (DEPOTESTOTERONE CYPIONATE) 100 MG/ML injection Inject 0.5 mLs (50 mg total) into the muscle 2 (two) times a week. For IM use only 10 mL 3   No current facility-administered medications for this visit.    PHYSICAL EXAM: Vitals:   10/28/24 1128  BP: 136/62  Pulse: 71  SpO2: 97%  Weight: 206 lb 9.6 oz (93.7 kg)  Height:  6' 1 (1.854 m)     Body mass index is 27.26 kg/m.  Wt Readings from Last 3 Encounters:  10/28/24 206 lb 9.6 oz (93.7 kg)  02/17/24 204 lb 12.8 oz (92.9 kg)  12/30/23 204 lb 3.2 oz (92.6 kg)    General: Well developed, well nourished male in no apparent distress.  HEENT: AT/[place], no external lesions. Hearing intact to the spoken word Eyes: Conjunctiva clear and no icterus.  Neurologic: Alert, oriented, normal speech Psychiatric: Does not appear depressed or anxious   PERTINENT HISTORIC LABORATORY AND IMAGING STUDIES:  All pertinent laboratory results were reviewed. Please see HPI also for further details.    ASSESSMENT / PLAN  1. Hypogonadotropic hypogonadism    Patient has hypogonadotropic hypogonadism, laboratory evaluation with low normal total and free testosterone  with some occasion with low testosterone  and low normal gonadotropins.  He had symptoms of fatigue, erectile dysfunction and low libido.  He was started on testosterone  cypionate in May 2025 and dose was gradually increased.    He is currently taking testosterone  cypionate 50 mg twice a week.  Uses testosterone  cypionate 200 mg/mL completed pharmacy.  Plan: -Check total testosterone  today.  - He has complaints of mild low energy on current dose.  Will adjust / optimize the testosterone  dose based on test results from today.  Kendle was seen today for hypothyroidism.  Diagnoses and all orders for this visit:  Hypogonadotropic hypogonadism -     Testosterone , Total, LC/MS/MS   DISPOSITION Follow up in clinic in 6 months suggested.  All questions answered and patient verbalized understanding of the plan.  Rudi Bunyard, MD [facility] Endocrinology [facility] [address] [place], [address] [place], [place] [address] Phone # [phone]  At least part of this note was generated using voice recognition software. Inadvertent word errors may have occurred, which were not recognized during the proofreading  process. "

## 2025-04-24 ENCOUNTER — Ambulatory Visit: Admitting: Endocrinology
